# Patient Record
Sex: Female | Born: 1990 | Hispanic: No | Marital: Married | Smoking: Never smoker
Health system: Southern US, Community
[De-identification: ages and names within clinical notes are randomized; demographics above are authoritative.]

## PROBLEM LIST (undated history)

## (undated) ENCOUNTER — Inpatient Hospital Stay (HOSPITAL_COMMUNITY): Payer: Self-pay

## (undated) DIAGNOSIS — R87619 Unspecified abnormal cytological findings in specimens from cervix uteri: Secondary | ICD-10-CM

## (undated) DIAGNOSIS — R87629 Unspecified abnormal cytological findings in specimens from vagina: Secondary | ICD-10-CM

## (undated) DIAGNOSIS — O350XX0 Maternal care for (suspected) central nervous system malformation in fetus, not applicable or unspecified: Secondary | ICD-10-CM

## (undated) DIAGNOSIS — D649 Anemia, unspecified: Secondary | ICD-10-CM

## (undated) DIAGNOSIS — IMO0002 Reserved for concepts with insufficient information to code with codable children: Secondary | ICD-10-CM

## (undated) DIAGNOSIS — O3503X0 Maternal care for (suspected) central nervous system malformation or damage in fetus, choroid plexus cysts, not applicable or unspecified: Secondary | ICD-10-CM

## (undated) HISTORY — DX: Anemia, unspecified: D64.9

## (undated) HISTORY — DX: Maternal care for (suspected) central nervous system malformation or damage in fetus, choroid plexus cysts, not applicable or unspecified: O35.03X0

## (undated) HISTORY — DX: Unspecified abnormal cytological findings in specimens from cervix uteri: R87.619

## (undated) HISTORY — DX: Reserved for concepts with insufficient information to code with codable children: IMO0002

## (undated) HISTORY — PX: NO PAST SURGERIES: SHX2092

## (undated) HISTORY — DX: Maternal care for (suspected) central nervous system malformation in fetus, not applicable or unspecified: O35.0XX0

---

## 2008-03-15 ENCOUNTER — Emergency Department (HOSPITAL_COMMUNITY): Admission: EM | Admit: 2008-03-15 | Discharge: 2008-03-15 | Payer: Self-pay | Admitting: Emergency Medicine

## 2008-05-21 ENCOUNTER — Ambulatory Visit (HOSPITAL_COMMUNITY): Admission: RE | Admit: 2008-05-21 | Discharge: 2008-05-21 | Payer: Self-pay | Admitting: Obstetrics & Gynecology

## 2008-10-15 ENCOUNTER — Ambulatory Visit (HOSPITAL_COMMUNITY): Admission: RE | Admit: 2008-10-15 | Discharge: 2008-10-15 | Payer: Self-pay | Admitting: Obstetrics & Gynecology

## 2008-10-17 ENCOUNTER — Ambulatory Visit: Payer: Self-pay | Admitting: Advanced Practice Midwife

## 2008-10-17 ENCOUNTER — Inpatient Hospital Stay (HOSPITAL_COMMUNITY): Admission: AD | Admit: 2008-10-17 | Discharge: 2008-10-19 | Payer: Self-pay | Admitting: Obstetrics & Gynecology

## 2008-11-05 ENCOUNTER — Ambulatory Visit: Admission: RE | Admit: 2008-11-05 | Discharge: 2008-11-05 | Payer: Self-pay | Admitting: Obstetrics & Gynecology

## 2008-11-09 ENCOUNTER — Ambulatory Visit: Admission: RE | Admit: 2008-11-09 | Discharge: 2008-11-09 | Payer: Self-pay | Admitting: Obstetrics & Gynecology

## 2009-03-01 ENCOUNTER — Ambulatory Visit: Payer: Self-pay | Admitting: Internal Medicine

## 2009-03-19 ENCOUNTER — Ambulatory Visit: Payer: Self-pay | Admitting: Internal Medicine

## 2009-03-19 LAB — CONVERTED CEMR LAB
Chlamydia, Swab/Urine, PCR: NEGATIVE
GC Probe Amp, Urine: NEGATIVE
hCG, Beta Chain, Quant, S: <2 milliintl units/mL

## 2009-03-22 ENCOUNTER — Ambulatory Visit: Payer: Self-pay | Admitting: Internal Medicine

## 2009-03-31 ENCOUNTER — Ambulatory Visit (HOSPITAL_COMMUNITY): Admission: RE | Admit: 2009-03-31 | Discharge: 2009-03-31 | Payer: Self-pay | Admitting: Internal Medicine

## 2009-04-07 ENCOUNTER — Encounter (INDEPENDENT_AMBULATORY_CARE_PROVIDER_SITE_OTHER): Payer: Self-pay | Admitting: Internal Medicine

## 2009-04-07 ENCOUNTER — Ambulatory Visit: Payer: Self-pay | Admitting: Internal Medicine

## 2009-04-07 LAB — CONVERTED CEMR LAB
CRP: 0.1 mg/dL (ref <0.6)
TSH: 1.256 microintl units/mL (ref 0.350–4.500)

## 2009-09-02 ENCOUNTER — Ambulatory Visit (HOSPITAL_COMMUNITY): Admission: RE | Admit: 2009-09-02 | Discharge: 2009-09-02 | Payer: Self-pay | Admitting: Obstetrics & Gynecology

## 2009-11-15 ENCOUNTER — Ambulatory Visit (HOSPITAL_COMMUNITY): Admission: RE | Admit: 2009-11-15 | Discharge: 2009-11-15 | Payer: Self-pay | Admitting: Family Medicine

## 2009-12-06 ENCOUNTER — Inpatient Hospital Stay (HOSPITAL_COMMUNITY): Admission: RE | Admit: 2009-12-06 | Discharge: 2009-12-06 | Payer: Self-pay | Admitting: Family Medicine

## 2009-12-07 ENCOUNTER — Inpatient Hospital Stay (HOSPITAL_COMMUNITY)
Admission: AD | Admit: 2009-12-07 | Discharge: 2009-12-07 | Payer: Self-pay | Source: Home / Self Care | Admitting: Family Medicine

## 2010-01-26 ENCOUNTER — Inpatient Hospital Stay (HOSPITAL_COMMUNITY)
Admission: AD | Admit: 2010-01-26 | Discharge: 2010-01-28 | Payer: Self-pay | Source: Home / Self Care | Attending: Family Medicine | Admitting: Family Medicine

## 2010-01-31 LAB — RPR: RPR Ser Ql: NONREACTIVE

## 2010-03-29 LAB — FETAL FIBRONECTIN: Fetal Fibronectin: NEGATIVE

## 2010-04-21 LAB — HEPATITIS B SURFACE ANTIGEN: Hepatitis B Surface Ag: NEGATIVE

## 2010-04-21 LAB — CBC
HCT: 39.3 % (ref 36.0–46.0)
Hemoglobin: 13.7 g/dL (ref 12.0–15.0)
MCHC: 34.9 g/dL (ref 30.0–36.0)
MCV: 95 fL (ref 78.0–100.0)
Platelets: 215 10*3/uL (ref 150–400)
RBC: 4.13 MIL/uL (ref 3.87–5.11)
RDW: 13 % (ref 11.5–15.5)
WBC: 11.1 10*3/uL — ABNORMAL HIGH (ref 4.0–10.5)

## 2010-04-21 LAB — RPR: RPR Ser Ql: NONREACTIVE

## 2010-07-26 ENCOUNTER — Other Ambulatory Visit: Payer: Self-pay | Admitting: Family Medicine

## 2010-07-26 DIAGNOSIS — Z3687 Encounter for antenatal screening for uncertain dates: Secondary | ICD-10-CM

## 2010-07-26 DIAGNOSIS — N889 Noninflammatory disorder of cervix uteri, unspecified: Secondary | ICD-10-CM

## 2010-07-26 LAB — ABO/RH: RH Type: POSITIVE

## 2010-07-26 LAB — HIV ANTIBODY (ROUTINE TESTING W REFLEX): HIV: NONREACTIVE

## 2010-07-26 LAB — ANTIBODY SCREEN: Antibody Screen: NEGATIVE

## 2010-07-26 LAB — RUBELLA ANTIBODY, IGM: Rubella: IMMUNE

## 2010-08-01 ENCOUNTER — Other Ambulatory Visit: Payer: Self-pay | Admitting: Family Medicine

## 2010-08-01 ENCOUNTER — Ambulatory Visit (HOSPITAL_COMMUNITY)
Admission: RE | Admit: 2010-08-01 | Discharge: 2010-08-01 | Disposition: A | Discharge status: Home / Self Care | Payer: Medicaid Other | Source: Ambulatory Visit | Attending: Family Medicine | Admitting: Family Medicine

## 2010-08-01 DIAGNOSIS — Z3689 Encounter for other specified antenatal screening: Secondary | ICD-10-CM | POA: Insufficient documentation

## 2010-08-01 DIAGNOSIS — N889 Noninflammatory disorder of cervix uteri, unspecified: Secondary | ICD-10-CM

## 2010-08-01 DIAGNOSIS — Z8751 Personal history of pre-term labor: Secondary | ICD-10-CM | POA: Insufficient documentation

## 2010-08-01 DIAGNOSIS — Z3687 Encounter for antenatal screening for uncertain dates: Secondary | ICD-10-CM

## 2010-12-15 ENCOUNTER — Other Ambulatory Visit (HOSPITAL_COMMUNITY): Payer: Self-pay | Admitting: Nurse Practitioner

## 2010-12-15 DIAGNOSIS — Z3689 Encounter for other specified antenatal screening: Secondary | ICD-10-CM

## 2010-12-19 ENCOUNTER — Ambulatory Visit (HOSPITAL_COMMUNITY)
Admission: RE | Admit: 2010-12-19 | Discharge: 2010-12-19 | Disposition: A | Discharge status: Home / Self Care | Payer: Medicaid Other | Source: Ambulatory Visit | Attending: Nurse Practitioner | Admitting: Nurse Practitioner

## 2010-12-19 DIAGNOSIS — O358XX0 Maternal care for other (suspected) fetal abnormality and damage, not applicable or unspecified: Secondary | ICD-10-CM | POA: Insufficient documentation

## 2010-12-19 DIAGNOSIS — Z363 Encounter for antenatal screening for malformations: Secondary | ICD-10-CM | POA: Insufficient documentation

## 2010-12-19 DIAGNOSIS — Z3689 Encounter for other specified antenatal screening: Secondary | ICD-10-CM

## 2010-12-19 DIAGNOSIS — O0930 Supervision of pregnancy with insufficient antenatal care, unspecified trimester: Secondary | ICD-10-CM | POA: Insufficient documentation

## 2010-12-19 DIAGNOSIS — Z1389 Encounter for screening for other disorder: Secondary | ICD-10-CM | POA: Insufficient documentation

## 2011-01-13 LAB — STREP B DNA PROBE: GBS: NEGATIVE

## 2011-01-17 NOTE — L&D Delivery Note (Signed)
Delivery Note At 5:40 AM a viable and healthy female was delivered via Vaginal, Spontaneous Delivery (Presentation: Left Occiput Posterior).  APGAR: 9, 9; weight 8 lb 7.1 oz (3830 g).   Placenta status: Intact, Spontaneous.  Cord: 3 vessels with the following complications: None.  Cord pH: not indicated.  Anesthesia: None  Episiotomy: None Lacerations: None Suture Repair: n/a Est. Blood Loss (mL): 200  Mom to postpartum.  Baby to nursery-stable.   D. Piloto Sherron Flemings Paz. MD PGY-1  02/09/2011, 8:05 AM   Present for 2nd and 3rd stages. Nuchal cord reduced. IM pit given Simone Tuckey 8:08 AM

## 2011-01-25 ENCOUNTER — Other Ambulatory Visit (HOSPITAL_COMMUNITY): Payer: Self-pay | Admitting: Physician Assistant

## 2011-01-26 ENCOUNTER — Ambulatory Visit (HOSPITAL_COMMUNITY)
Admission: RE | Admit: 2011-01-26 | Discharge: 2011-01-26 | Disposition: A | Discharge status: Home / Self Care | Payer: Medicaid Other | Source: Ambulatory Visit | Attending: Physician Assistant | Admitting: Physician Assistant

## 2011-01-26 DIAGNOSIS — Z3689 Encounter for other specified antenatal screening: Secondary | ICD-10-CM | POA: Insufficient documentation

## 2011-01-26 DIAGNOSIS — O0930 Supervision of pregnancy with insufficient antenatal care, unspecified trimester: Secondary | ICD-10-CM | POA: Insufficient documentation

## 2011-02-07 ENCOUNTER — Other Ambulatory Visit (HOSPITAL_COMMUNITY): Payer: Self-pay | Admitting: Physician Assistant

## 2011-02-07 DIAGNOSIS — O480 Post-term pregnancy: Secondary | ICD-10-CM

## 2011-02-08 ENCOUNTER — Telehealth (HOSPITAL_COMMUNITY): Payer: Self-pay | Admitting: *Deleted

## 2011-02-08 ENCOUNTER — Encounter (HOSPITAL_COMMUNITY): Payer: Self-pay | Admitting: *Deleted

## 2011-02-08 NOTE — Telephone Encounter (Signed)
Preadmission screen  

## 2011-02-09 ENCOUNTER — Ambulatory Visit (HOSPITAL_COMMUNITY): Payer: Medicaid Other

## 2011-02-09 ENCOUNTER — Inpatient Hospital Stay (HOSPITAL_COMMUNITY)
Admission: AD | Admit: 2011-02-09 | Discharge: 2011-02-10 | DRG: 775 | Disposition: A | Discharge status: Home / Self Care | Payer: Medicaid Other | Source: Ambulatory Visit | Attending: Obstetrics & Gynecology | Admitting: Obstetrics & Gynecology

## 2011-02-09 ENCOUNTER — Encounter (HOSPITAL_COMMUNITY): Payer: Self-pay | Admitting: *Deleted

## 2011-02-09 LAB — CBC
HCT: 28.9 % — ABNORMAL LOW (ref 36.0–46.0)
MCH: 27.8 pg (ref 26.0–34.0)
MCV: 84.5 fL (ref 78.0–100.0)
RBC: 3.42 MIL/uL — ABNORMAL LOW (ref 3.87–5.11)
RDW: 14.4 % (ref 11.5–15.5)
WBC: 8.4 10*3/uL (ref 4.0–10.5)

## 2011-02-09 MED ORDER — BENZOCAINE-MENTHOL 20-0.5 % EX AERO: 1.0000 "application " | INHALATION_SPRAY | CUTANEOUS | Status: DC | PRN

## 2011-02-09 MED ORDER — OXYTOCIN 10 UNIT/ML IJ SOLN
10.0000 [IU] | Freq: Once | INTRAMUSCULAR | Status: AC
  Administered 2011-02-09: 10 [IU] via INTRAMUSCULAR

## 2011-02-09 MED ORDER — ONDANSETRON HCL 4 MG/2ML IJ SOLN: 4.0000 mg | INTRAMUSCULAR | Status: DC | PRN

## 2011-02-09 MED ORDER — FLEET ENEMA 7-19 GM/118ML RE ENEM: 1.0000 | ENEMA | RECTAL | Status: DC | PRN

## 2011-02-09 MED ORDER — DIPHENHYDRAMINE HCL 25 MG PO CAPS: 25.0000 mg | ORAL_CAPSULE | Freq: Four times a day (QID) | ORAL | Status: DC | PRN

## 2011-02-09 MED ORDER — LIDOCAINE HCL (PF) 1 % IJ SOLN
30.0000 mL | INTRAMUSCULAR | Status: DC | PRN
  Filled 2011-02-09: qty 30

## 2011-02-09 MED ORDER — LACTATED RINGERS IV SOLN
500.0000 mL | INTRAVENOUS | Status: DC | PRN
Start: 2011-02-09 — End: 2011-02-09

## 2011-02-09 MED ORDER — OXYTOCIN 10 UNIT/ML IJ SOLN
INTRAMUSCULAR | Status: AC
  Filled 2011-02-09: qty 2

## 2011-02-09 MED ORDER — DIBUCAINE 1 % RE OINT: 1.0000 "application " | TOPICAL_OINTMENT | RECTAL | Status: DC | PRN

## 2011-02-09 MED ORDER — OXYTOCIN BOLUS FROM INFUSION
500.0000 mL | Freq: Once | INTRAVENOUS | Status: DC
  Filled 2011-02-09: qty 500

## 2011-02-09 MED ORDER — OXYCODONE-ACETAMINOPHEN 5-325 MG PO TABS
1.0000 | ORAL_TABLET | ORAL | Status: DC | PRN
  Administered 2011-02-10: 1 via ORAL
  Filled 2011-02-09: qty 1

## 2011-02-09 MED ORDER — IBUPROFEN 600 MG PO TABS
600.0000 mg | ORAL_TABLET | Freq: Four times a day (QID) | ORAL | Status: DC | PRN
  Administered 2011-02-09: 600 mg via ORAL
  Filled 2011-02-09: qty 1

## 2011-02-09 MED ORDER — ZOLPIDEM TARTRATE 5 MG PO TABS: 5.0000 mg | ORAL_TABLET | Freq: Every evening | ORAL | Status: DC | PRN

## 2011-02-09 MED ORDER — ONDANSETRON HCL 4 MG PO TABS: 4.0000 mg | ORAL_TABLET | ORAL | Status: DC | PRN

## 2011-02-09 MED ORDER — TETANUS-DIPHTH-ACELL PERTUSSIS 5-2.5-18.5 LF-MCG/0.5 IM SUSP
0.5000 mL | Freq: Once | INTRAMUSCULAR | Status: AC
  Administered 2011-02-10: 0.5 mL via INTRAMUSCULAR
  Filled 2011-02-09: qty 0.5

## 2011-02-09 MED ORDER — LANOLIN HYDROUS EX OINT: TOPICAL_OINTMENT | CUTANEOUS | Status: DC | PRN

## 2011-02-09 MED ORDER — PRENATAL MULTIVITAMIN CH
1.0000 | ORAL_TABLET | Freq: Every day | ORAL | Status: DC
  Administered 2011-02-09 – 2011-02-10 (×2): 1 via ORAL
  Filled 2011-02-09 (×2): qty 1

## 2011-02-09 MED ORDER — SENNOSIDES-DOCUSATE SODIUM 8.6-50 MG PO TABS
2.0000 | ORAL_TABLET | Freq: Every day | ORAL | Status: DC
  Administered 2011-02-09: 2 via ORAL

## 2011-02-09 MED ORDER — IBUPROFEN 600 MG PO TABS
600.0000 mg | ORAL_TABLET | Freq: Four times a day (QID) | ORAL | Status: DC
  Administered 2011-02-09 – 2011-02-10 (×4): 600 mg via ORAL
  Filled 2011-02-09 (×4): qty 1

## 2011-02-09 MED ORDER — WITCH HAZEL-GLYCERIN EX PADS: 1.0000 "application " | MEDICATED_PAD | CUTANEOUS | Status: DC | PRN

## 2011-02-09 MED ORDER — OXYTOCIN 20 UNITS IN LACTATED RINGERS INFUSION - SIMPLE: 125.0000 mL/h | Freq: Once | INTRAVENOUS | Status: DC

## 2011-02-09 MED ORDER — LACTATED RINGERS IV SOLN: INTRAVENOUS | Status: DC

## 2011-02-09 MED ORDER — OXYCODONE-ACETAMINOPHEN 5-325 MG PO TABS: 2.0000 | ORAL_TABLET | ORAL | Status: DC | PRN

## 2011-02-09 MED ORDER — ACETAMINOPHEN 325 MG PO TABS: 650.0000 mg | ORAL_TABLET | ORAL | Status: DC | PRN

## 2011-02-09 MED ORDER — SIMETHICONE 80 MG PO CHEW: 80.0000 mg | CHEWABLE_TABLET | ORAL | Status: DC | PRN

## 2011-02-09 MED ORDER — ONDANSETRON HCL 4 MG/2ML IJ SOLN: 4.0000 mg | Freq: Four times a day (QID) | INTRAMUSCULAR | Status: DC | PRN

## 2011-02-09 MED ORDER — CITRIC ACID-SODIUM CITRATE 334-500 MG/5ML PO SOLN: 30.0000 mL | ORAL | Status: DC | PRN

## 2011-02-09 NOTE — H&P (Signed)
Shannon Hernandez is a 21 y.o. female presenting with regular painful contractions. No vaginal leakage of  fluid or bleeding. No headaches, vision disturbances nor epigastric pain. [facility]    History OB History    Grav Para Term Preterm Abortions TAB SAB Ect Mult Living   3 3 3       3      Past Medical History  Diagnosis Date  . Anemia   . Abnormal Pap smear     ASCUS  . Choroid plexus cysts, fetal, affecting care of mother, antepartum    History reviewed. No pertinent past surgical history. Family History: family history includes Asthma in her brother and Diabetes in her paternal grandfather. Social History:  reports that she has never smoked. She has never used smokeless tobacco. She reports that she does not drink alcohol or use illicit drugs.  Review of Systems  Constitutional: Negative.  Negative for fever.  Eyes: Negative for blurred vision.  Respiratory: Negative.   Cardiovascular: Negative.   Gastrointestinal: Positive for abdominal pain.  Genitourinary: Negative.   Musculoskeletal: Negative.   Skin: Negative.   Neurological: Negative.  Negative for headaches.    Dilation: 10 Effacement (%): 100 Station: 0 Exam by:: Piloto,CNM Blood pressure 110/67, pulse 81, temperature 98 F (36.7 C), temperature source Oral, resp. rate 20, height 5\' 6"  (1.676 m), weight 59.875 kg (132 lb), unknown if currently breastfeeding. Exam Physical Exam  Constitutional: She appears distressed.       Due to painful contractions.  HENT:  Mouth/Throat: Oropharynx is clear and moist.  Eyes: Conjunctivae are normal.  Neck: Neck supple.  Cardiovascular: Normal rate, regular rhythm and normal heart sounds.   No murmur heard. Respiratory: Effort normal and breath sounds normal.  GI: Soft. Bowel sounds are normal.    Prenatal labs: ABO, Rh: B/Positive/-- (07/10 0000) Antibody: Negative (07/10 0000) Rubella: Immune (07/10 0000) RPR: Nonreactive (07/10  0000)  HBsAg:   negative HIV: Non-reactive (07/10 0000)  GBS: Negative (12/28 0000)  2h GTT 76/89/90  Assessment: 1. Labor: active.  2. Fetal Wellbeing: Category 1 3. Pain control: no needed pt came fully dilated and pushing. 4. GBS: negative. 5. 40.5  week IUP  Plan:  1. Admit to BS 2. Routine L&D orders 3. Analgesia/anesthesia PRN    D. Piloto [employer]. MD PGY-1 02/09/2011, 6:26 AM

## 2011-02-09 NOTE — Progress Notes (Signed)
Dr. Aviva Signs notified of pt VE.  Will take to [facility] room 166. MD to place orders.

## 2011-02-09 NOTE — Progress Notes (Signed)
UR chart review completed.  

## 2011-02-09 NOTE — Progress Notes (Signed)
Pt states ctx started about an hour ago.  Was 3 cm on Tuesday.

## 2011-02-09 NOTE — Progress Notes (Signed)
Pt brought back from lobby in wheelchair for discomfort.  efm and toco placed.

## 2011-02-09 NOTE — Progress Notes (Signed)
Delivery of a live viable female at 34.

## 2011-02-10 ENCOUNTER — Inpatient Hospital Stay (HOSPITAL_COMMUNITY): Payer: Medicaid Other

## 2011-02-10 MED ORDER — IBUPROFEN 600 MG PO TABS: 600.0000 mg | ORAL_TABLET | Freq: Four times a day (QID) | ORAL | Status: AC

## 2011-02-10 NOTE — Progress Notes (Signed)
Post Partum Day 1 Subjective: no complaints, up ad lib, voiding, tolerating PO, + flatus and br/bo. Patient feeling well and wants to be d/c today.  Objective: Blood pressure 93/63, pulse 90, temperature 97.9 F (36.6 C), temperature source Oral, resp. rate 18, height 5\' 6"  (1.676 m), weight 59.875 kg (132 lb), unknown if currently breastfeeding.  Physical Exam:  General: alert, cooperative and no distress Lochia: appropriate Uterine Fundus: firm Incision: NA DVT Evaluation: No evidence of DVT seen on physical exam. Negative Homan's sign.   Basename 02/09/11 0510  HGB 9.5*  HCT 28.9*    Assessment/Plan: Discharge home and Contraception Husband states that they do not wish to use contraception.   LOS: 1 day   Arthor Captain 02/10/2011, 7:41 AM

## 2011-02-10 NOTE — Discharge Summary (Signed)
Obstetric Discharge Summary Reason for Admission: onset of labor Prenatal Procedures: none Intrapartum Procedures: spontaneous vaginal delivery Postpartum Procedures: none Complications-Operative and Postpartum: none Hemoglobin  Date Value Range Status  02/09/2011 9.5* 12.0-15.0 (g/dL) Final     HCT  Date Value Range Status  02/09/2011 28.9* 36.0-46.0 (%) Final    Discharge Diagnoses: Term Pregnancy-delivered  Discharge Information: Date: 02/10/2011 Activity: unrestricted Diet: routine Medications: PNV and Ibuprofen Condition: stable Instructions: refer to practice specific booklet Discharge to: home Follow-up Information    Follow up with [facility] in 6 weeks.         Newborn Data: Live born female  Birth Weight: 8 lb 7.1 oz (3830 g) APGAR: 9, 9  Home with mother.  Shannon Hernandez 02/10/2011, 7:27 AM

## 2011-02-10 NOTE — Progress Notes (Signed)
I agree with PA physical findings and plan.  D. Piloto De La Paz PYG-1

## 2011-02-14 ENCOUNTER — Inpatient Hospital Stay (HOSPITAL_COMMUNITY): Admission: RE | Admit: 2011-02-14 | Payer: Medicaid Other | Source: Ambulatory Visit

## 2011-12-07 ENCOUNTER — Ambulatory Visit: Payer: Medicaid Other | Admitting: Family Medicine

## 2011-12-13 ENCOUNTER — Encounter: Payer: Self-pay | Admitting: Emergency Medicine

## 2011-12-13 ENCOUNTER — Ambulatory Visit (INDEPENDENT_AMBULATORY_CARE_PROVIDER_SITE_OTHER): Payer: Self-pay | Admitting: Emergency Medicine

## 2011-12-13 VITALS — BP 93/60 | HR 82 | Temp 98.7°F | Ht 66.75 in | Wt 99.0 lb

## 2011-12-13 DIAGNOSIS — Z0000 Encounter for general adult medical examination without abnormal findings: Secondary | ICD-10-CM | POA: Insufficient documentation

## 2011-12-13 DIAGNOSIS — J309 Allergic rhinitis, unspecified: Secondary | ICD-10-CM

## 2011-12-13 DIAGNOSIS — R636 Underweight: Secondary | ICD-10-CM

## 2011-12-13 DIAGNOSIS — J302 Other seasonal allergic rhinitis: Secondary | ICD-10-CM

## 2011-12-13 NOTE — Progress Notes (Signed)
  Subjective:    Patient ID: Shannon Hernandez, female    DOB: 07/13/1990, 21 y.o.   MRN: [id number]  HPI Galena E Golonka is here for a new patient appt.  I have reviewed and updated the following as appropriate: allergies, current medications, past family history, past medical history, past social history, past surgical history and problem list SHx: never smoker  Allergies: Has a history of seasonal allergies.   Takes loratadine as needed for this.  Complaining of some red/itchy eyes today.  Pelvic pain: Occurs after walking for 3 hours.  Resolves with rest.   Review of Systems See HPI    Objective:   Physical Exam BP 93/60  Pulse 82  Temp 98.7 F (37.1 C) (Oral)  Ht 5' 6.75" (1.695 m)  Wt 99 lb (44.906 kg)  BMI 15.62 kg/m2  LMP 10/27/2011 Gen: alert, cooperative, very thin HEENT: At/[place], sclera mildly injected bilaterally, PERRL, MMM, no pharyngeal erythema or exudate Neck: supple, no LAD CV: RRR, no murmurs Pulm: CTAB, no wheezes or rales Abd: +BS, scaphoid, soft, NTND Ext: no edema, 2+ DP and radial pulses Neuro: normal gait Skin: no rashes or lesions      Assessment & Plan:

## 2011-12-13 NOTE — Assessment & Plan Note (Signed)
Taking loratadine as needed.  Will use saline eye drops for eye symptoms.  Would consider allergy eye drops once she has the orange card.

## 2011-12-13 NOTE — Patient Instructions (Addendum)
It was nice to meet you! Get some saline drops for you eyes.  Once you get the orange card, I can prescribe some allergy eye drops if your eyes are still bothering you. Do the exercises on the handout to help with the pelvic pain after walking. I will see you back in 1 year for your annual exam or sooner as needed.

## 2011-12-13 NOTE — Assessment & Plan Note (Signed)
Doing well.  No acute concerns.  Pap due 03/2012.  Declined flu shot.  Does not desire birth control.

## 2012-07-09 IMAGING — US US OB TRANSVAGINAL
1 series · 13 of 28 positions shown · non-contrast
Comparison: none

[Series 1: us ob comp +14 wk · 13 of 43 slices shown]
[im 2/43]
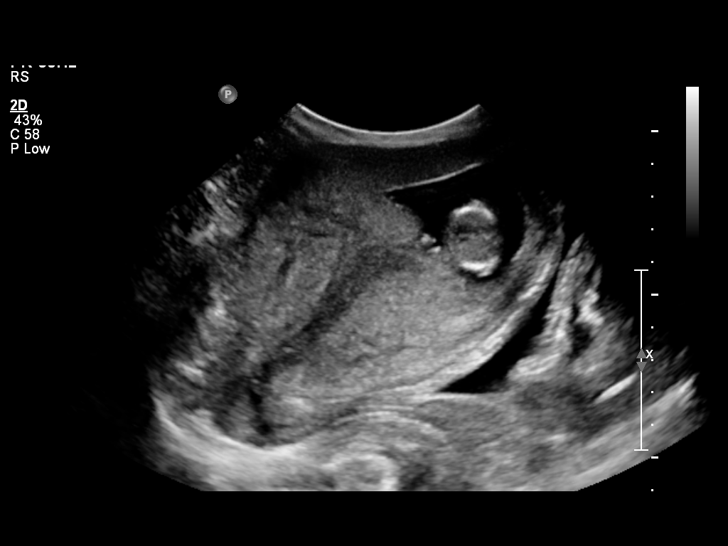
[im 5/43]
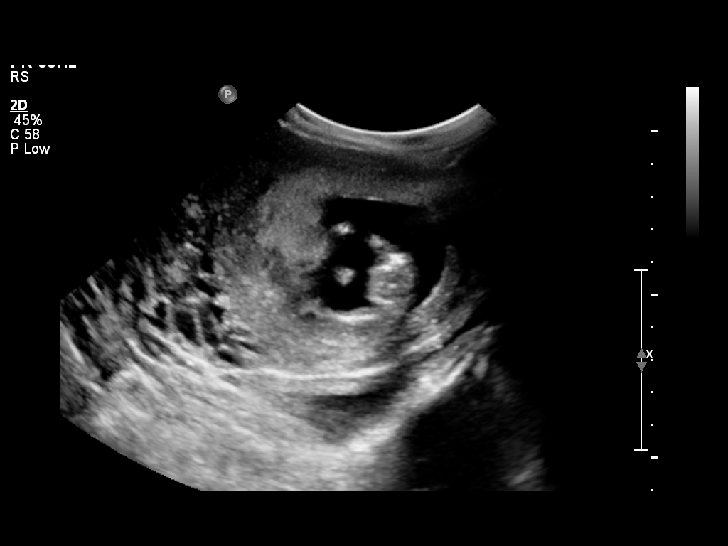
[im 8/43]
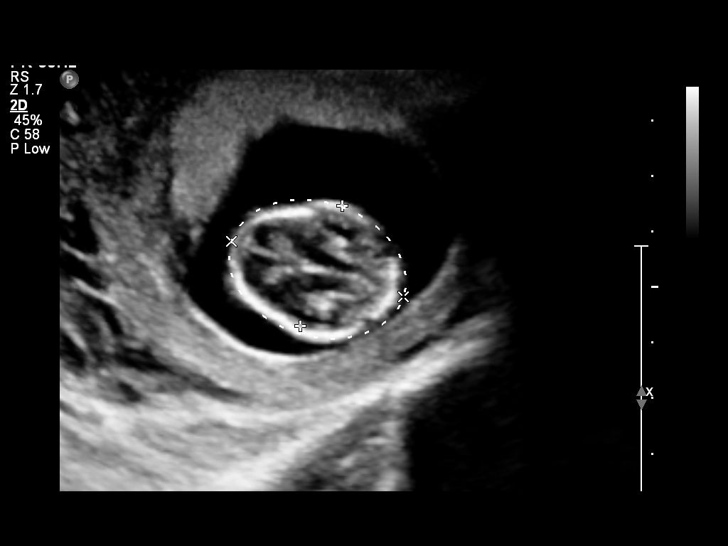
[im 11/43]
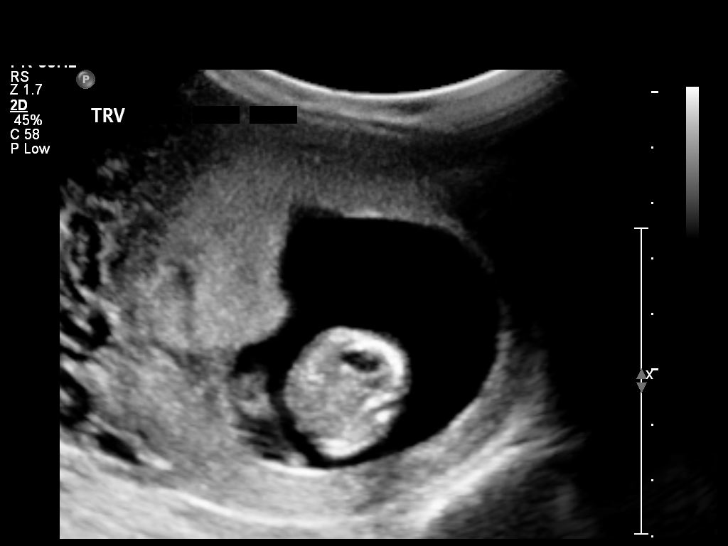
[im 15/43]
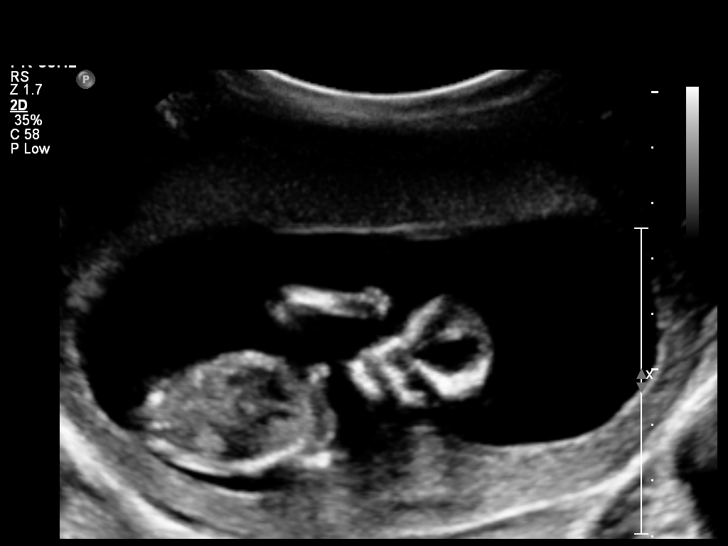
[im 18/43]
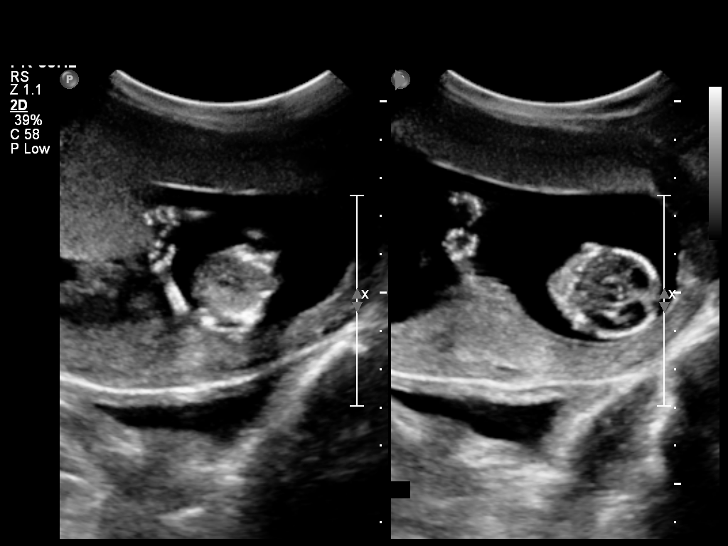
[im 22/43]
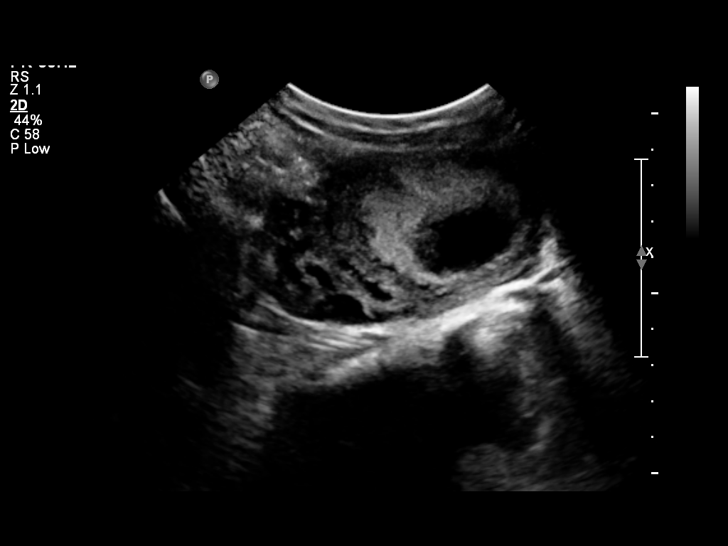
[im 25/43]
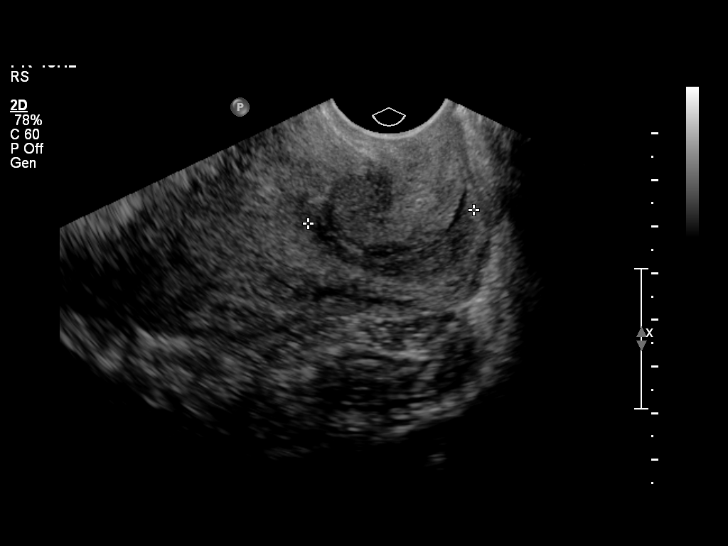
[im 29/43]
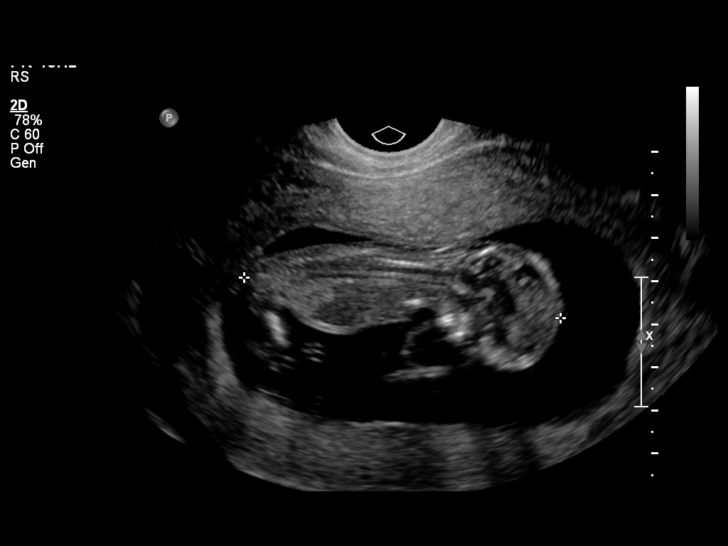
[im 32/43]
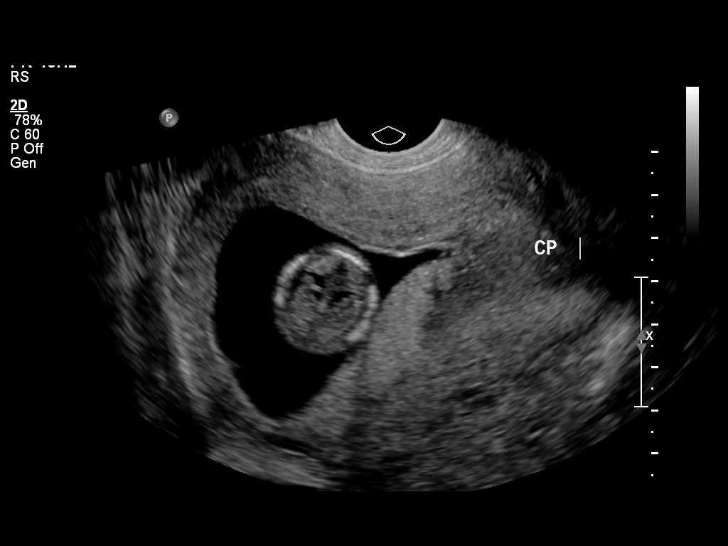
[im 35/43]
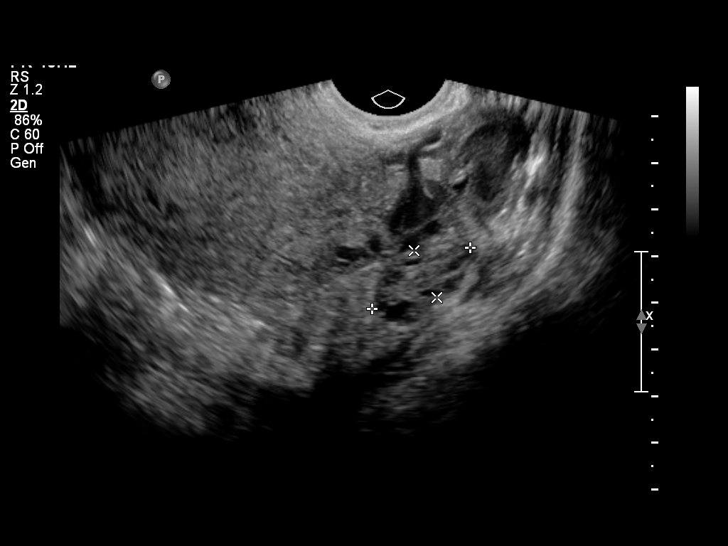
[im 38/43]
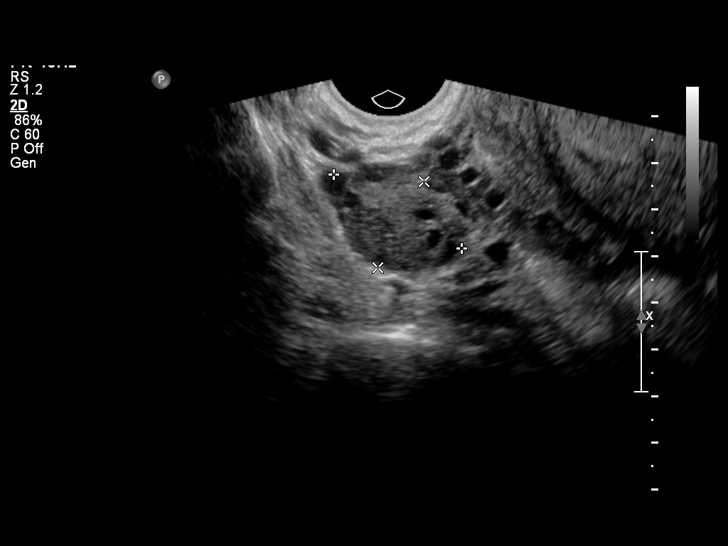
[im 41/43]
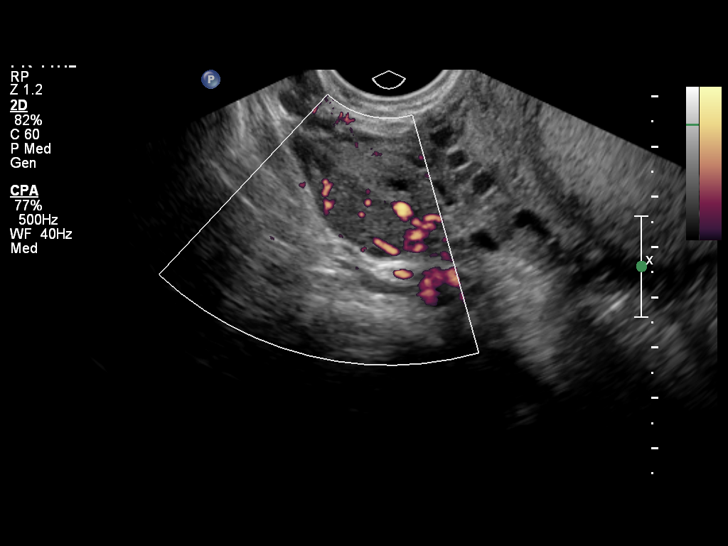

[13 of 28 positions shown; findings below may reference images not displayed]

OBSTETRICS REPORT
                      (Signed Final 08/01/2010 [DATE])

 Order#:         [id number]
                 _O
Procedures

 US OB COMP LESS 14 WKS                                76801.0
 US OB TRANSVAGINAL                                    76817.0
Indications

 Unsure of LMP;  Establish Gestational [AGE]
 Poor obstetric history: Previous preterm delivery
 Assess cervical length
Fetal Evaluation

 Fetal Heart Rate:  152                          bpm
 Cardiac Activity:  Observed
 Presentation:      Variable

 Amniotic Fluid
 AFI FV:      Subjectively within normal limits
Biometry

 CRL:       74  mm     G. Age:  13w 2d                 EDD:    02/04/11

 BPD:     22.8  mm     G. Age:  13w 5d                CI:
 HC:      89.6  mm     G. Age:  14w 0d
Gestational Age

 U/S Today:     13w 6d                                        EDD:   01/31/11
 Best:          13w 2d     Det. By:  U/S C R L (08/01/10)     EDD:   02/04/11
Anatomy

 Choroid Plexus:    Appears normal      Abdominal Wall:    Appears nml
                                                           (cord insert,
                                                           abd wall)
 Stomach:           Appears             Bladder:           Appears normal
                    normal, left
                    sided

 Other:     Technically difficult due to early GA.
Cervix Uterus Adnexa

 Cervical Length:    3.56     cm

 Cervix:       Closed.
 Left Ovary:    Within normal limits. 2.5cm x 1.1cm x  2.0cm
 Right Ovary:   Small corpus luteum noted. 3.2cm x 2.1cm x 1.8cm
Impression

 Single living IUP with US Gest. Age of 13w 2d, and EDD of
 02/04/2011.
 No significant maternal uterine or adnexal abnormality
 identified.
 Normal cervical length.
Recommendations

 US for fetal anatomic evaluation at 18-19 wks GA.

 questions or concerns.

## 2013-08-01 ENCOUNTER — Inpatient Hospital Stay (HOSPITAL_COMMUNITY): Payer: Medicaid Other

## 2013-08-01 ENCOUNTER — Encounter (HOSPITAL_COMMUNITY): Payer: Self-pay | Admitting: *Deleted

## 2013-08-01 ENCOUNTER — Inpatient Hospital Stay (HOSPITAL_COMMUNITY)
Admission: AD | Admit: 2013-08-01 | Discharge: 2013-08-01 | Disposition: A | Discharge status: Home / Self Care | Payer: Medicaid Other | Source: Ambulatory Visit | Attending: Obstetrics & Gynecology | Admitting: Obstetrics & Gynecology

## 2013-08-01 DIAGNOSIS — O4690 Antepartum hemorrhage, unspecified, unspecified trimester: Secondary | ICD-10-CM

## 2013-08-01 DIAGNOSIS — O3500X0 Maternal care for (suspected) central nervous system malformation or damage in fetus, unspecified, not applicable or unspecified: Secondary | ICD-10-CM | POA: Diagnosis not present

## 2013-08-01 DIAGNOSIS — O350XX0 Maternal care for (suspected) central nervous system malformation in fetus, not applicable or unspecified: Secondary | ICD-10-CM | POA: Diagnosis not present

## 2013-08-01 DIAGNOSIS — O209 Hemorrhage in early pregnancy, unspecified: Secondary | ICD-10-CM

## 2013-08-01 DIAGNOSIS — O208 Other hemorrhage in early pregnancy: Secondary | ICD-10-CM | POA: Insufficient documentation

## 2013-08-01 DIAGNOSIS — O468X1 Other antepartum hemorrhage, first trimester: Secondary | ICD-10-CM

## 2013-08-01 DIAGNOSIS — O418X10 Other specified disorders of amniotic fluid and membranes, first trimester, not applicable or unspecified: Secondary | ICD-10-CM

## 2013-08-01 LAB — OB RESULTS CONSOLE GC/CHLAMYDIA
Chlamydia: NEGATIVE
Gonorrhea: NEGATIVE

## 2013-08-01 LAB — CBC WITH DIFFERENTIAL/PLATELET
Basophils Absolute: 0 10*3/uL (ref 0.0–0.1)
Basophils Relative: 0 % (ref 0–1)
EOS PCT: 2 % (ref 0–5)
Eosinophils Absolute: 0.1 10*3/uL (ref 0.0–0.7)
HCT: 32.1 % — ABNORMAL LOW (ref 36.0–46.0)
Hemoglobin: 11 g/dL — ABNORMAL LOW (ref 12.0–15.0)
LYMPHS ABS: 2.2 10*3/uL (ref 0.7–4.0)
LYMPHS PCT: 35 % (ref 12–46)
MCH: 30.4 pg (ref 26.0–34.0)
MCHC: 34.3 g/dL (ref 30.0–36.0)
MCV: 88.7 fL (ref 78.0–100.0)
MONO ABS: 0.4 10*3/uL (ref 0.1–1.0)
Monocytes Relative: 7 % (ref 3–12)
Neutro Abs: 3.6 10*3/uL (ref 1.7–7.7)
Neutrophils Relative %: 56 % (ref 43–77)
Platelets: 153 10*3/uL (ref 150–400)
RBC: 3.62 MIL/uL — AB (ref 3.87–5.11)
RDW: 13.4 % (ref 11.5–15.5)
WBC: 6.3 10*3/uL (ref 4.0–10.5)

## 2013-08-01 LAB — WET PREP, GENITAL
Clue Cells Wet Prep HPF POC: NONE SEEN
TRICH WET PREP: NONE SEEN
Yeast Wet Prep HPF POC: NONE SEEN

## 2013-08-01 LAB — URINALYSIS, ROUTINE W REFLEX MICROSCOPIC
BILIRUBIN URINE: NEGATIVE
Glucose, UA: NEGATIVE mg/dL
KETONES UR: NEGATIVE mg/dL
Leukocytes, UA: NEGATIVE
NITRITE: NEGATIVE
PH: 6 (ref 5.0–8.0)
PROTEIN: NEGATIVE mg/dL
Specific Gravity, Urine: <1.005 — ABNORMAL LOW (ref 1.005–1.030)
Urobilinogen, UA: 0.2 mg/dL (ref 0.0–1.0)

## 2013-08-01 LAB — URINE MICROSCOPIC-ADD ON

## 2013-08-01 LAB — POCT PREGNANCY, URINE: Preg Test, Ur: POSITIVE — AB

## 2013-08-01 LAB — HCG, QUANTITATIVE, PREGNANCY: hCG, Beta Chain, Quant, S: 62903 m[IU]/mL — ABNORMAL HIGH (ref <5)

## 2013-08-01 NOTE — MAU Provider Note (Signed)
History     CSN: [id number]  Arrival date and time: 08/01/13 4098   First Provider Initiated Contact with Patient 08/01/13 0130      No chief complaint on file.  HPI Ms. Shannon Hernandez is a 23 y.o. [phone] at [redacted]w[redacted]d by LMP who presents to MAU today with complaint of vaginal bleeding since 1230 am. She states LMP of 06/07/13. She is having mild low back pain. She denies abdominal pain, discharge, fever, N/V/D or constipation. She states that bleeding is a dark brown color.   OB History   Grav Para Term Preterm Abortions TAB SAB Ect Mult Living   4 3 3       3       Past Medical History  Diagnosis Date  . Anemia   . Abnormal Pap smear     ASCUS  . Choroid plexus cysts, fetal, affecting care of mother, antepartum     No past surgical history on file.  Family History  Problem Relation Age of Onset  . Asthma Brother   . Diabetes Paternal Grandfather     History  Substance Use Topics  . Smoking status: Never Smoker   . Smokeless tobacco: Never Used  . Alcohol Use: No    Allergies: No Known Allergies  Prescriptions prior to admission  Medication Sig Dispense Refill  . Multiple Vitamin (MULTIVITAMIN) tablet Take 1 tablet by mouth daily.      Marland Kitchen loratadine (CLARITIN) 10 MG tablet Take 10 mg by mouth daily.        Review of Systems  Constitutional: Negative for fever and malaise/fatigue.  Gastrointestinal: Negative for nausea, vomiting, abdominal pain, diarrhea and constipation.  Genitourinary: Negative for dysuria, urgency and frequency.       + vaginal bleeding Neg - vaginal discharge  Musculoskeletal: Positive for back pain.   Physical Exam   Blood pressure 112/68, pulse 78, temperature 98.4 F (36.9 C), resp. rate 20, height 5\' 6"  (1.676 m), weight 105 lb (47.628 kg), last menstrual period 06/07/2013.  Physical Exam  Constitutional: She is oriented to person, place, and time. She appears well-developed and well-nourished. No distress.  HENT:  Head:  Normocephalic.  Cardiovascular: Normal rate.   Respiratory: Effort normal.  GI: Soft. She exhibits no distension and no mass. There is no tenderness. There is no rebound and no guarding.  Genitourinary: Uterus is enlarged (appropriate for GA). Uterus is not tender. Cervix exhibits no motion tenderness, no discharge and no friability. Right adnexum displays no mass and no tenderness. Left adnexum displays no mass and no tenderness. There is bleeding (small amount of thin, brown blood noted) around the vagina. No vaginal discharge found.  Neurological: She is alert and oriented to person, place, and time.  Skin: Skin is warm and dry. No erythema.  Psychiatric: She has a normal mood and affect.   Results for orders placed during the hospital encounter of 08/01/13 (from the past 24 hour(s))  URINALYSIS, ROUTINE W REFLEX MICROSCOPIC     Status: Abnormal   Collection Time    08/01/13  1:02 AM      Result Value Ref Range   Color, Urine YELLOW  YELLOW   APPearance CLEAR  CLEAR   Specific Gravity, Urine <1.005 (*) 1.005 - 1.030   pH 6.0  5.0 - 8.0   Glucose, UA NEGATIVE  NEGATIVE mg/dL   Hgb urine dipstick LARGE (*) NEGATIVE   Bilirubin Urine NEGATIVE  NEGATIVE   Ketones, ur NEGATIVE  NEGATIVE mg/dL  Protein, ur NEGATIVE  NEGATIVE mg/dL   Urobilinogen, UA 0.2  0.0 - 1.0 mg/dL   Nitrite NEGATIVE  NEGATIVE   Leukocytes, UA NEGATIVE  NEGATIVE  URINE MICROSCOPIC-ADD ON     Status: None   Collection Time    08/01/13  1:02 AM      Result Value Ref Range   Squamous Epithelial / LPF RARE  RARE   WBC, UA 0-2  <3 WBC/hpf   RBC / HPF 3-6  <3 RBC/hpf   Bacteria, UA RARE  RARE  POCT PREGNANCY, URINE     Status: Abnormal   Collection Time    08/01/13  1:28 AM      Result Value Ref Range   Preg Test, Ur POSITIVE (*) NEGATIVE  WET PREP, GENITAL     Status: Abnormal   Collection Time    08/01/13  1:40 AM      Result Value Ref Range   Yeast Wet Prep HPF POC NONE SEEN  NONE SEEN   Trich, Wet Prep  NONE SEEN  NONE SEEN   Clue Cells Wet Prep HPF POC NONE SEEN  NONE SEEN   WBC, Wet Prep HPF POC MODERATE (*) NONE SEEN  CBC WITH DIFFERENTIAL     Status: Abnormal   Collection Time    08/01/13  1:58 AM      Result Value Ref Range   WBC 6.3  4.0 - 10.5 K/uL   RBC 3.62 (*) 3.87 - 5.11 MIL/uL   Hemoglobin 11.0 (*) 12.0 - 15.0 g/dL   HCT 40.9 (*) 81.1 - 91.4 %   MCV 88.7  78.0 - 100.0 fL   MCH 30.4  26.0 - 34.0 pg   MCHC 34.3  30.0 - 36.0 g/dL   RDW 78.2  95.6 - 21.3 %   Platelets 153  150 - 400 K/uL   Neutrophils Relative % 56  43 - 77 %   Neutro Abs 3.6  1.7 - 7.7 K/uL   Lymphocytes Relative 35  12 - 46 %   Lymphs Abs 2.2  0.7 - 4.0 K/uL   Monocytes Relative 7  3 - 12 %   Monocytes Absolute 0.4  0.1 - 1.0 K/uL   Eosinophils Relative 2  0 - 5 %   Eosinophils Absolute 0.1  0.0 - 0.7 K/uL   Basophils Relative 0  0 - 1 %   Basophils Absolute 0.0  0.0 - 0.1 K/uL  HCG, QUANTITATIVE, PREGNANCY     Status: Abnormal   Collection Time    08/01/13  1:58 AM      Result Value Ref Range   hCG, Beta Chain, Sharene Butters, [place] [address] (*) <5 mIU/mL    US Ob Comp Less 14 Wks  08/01/2013   CLINICAL DATA:  Vaginal bleeding and pregnancy  EXAM: OBSTETRIC <14 WK [place] AND TRANSVAGINAL OB US  TECHNIQUE: Both transabdominal and transvaginal ultrasound examinations were performed for complete evaluation of the gestation as well as the maternal uterus, adnexal regions, and pelvic cul-de-sac. Transvaginal technique was performed to assess early pregnancy.  COMPARISON:  01/26/2011  FINDINGS: Intrauterine gestational sac: Visualized/normal in shape.  Yolk sac:  Present  Embryo:  Present  Cardiac Activity: Present  Heart Rate:  163 bpm  CRL:   14  mm   7 w 5d                  [place] EDC: 03/15/2014  Maternal uterus/adnexae: No adnexal abnormality. No free pelvic fluid. There is  a subchorionic hemorrhage which is irregularly-shaped. Maximal diameter is 2 cm.  IMPRESSION: 1. Single living intrauterine gestation. Sonographic age is 7  weeks 5 days. 2. 2 cm subchronic hematoma.   Electronically Signed   By: Tiburcio PeaJonathan  Watts M.D.   On: 08/01/2013 03:52   [place] Ob Transvaginal  08/01/2013   CLINICAL DATA:  Vaginal bleeding and pregnancy  EXAM: OBSTETRIC <14 WK US AND TRANSVAGINAL OB US  TECHNIQUE: Both transabdominal and transvaginal ultrasound examinations were performed for complete evaluation of the gestation as well as the maternal uterus, adnexal regions, and pelvic cul-de-sac. Transvaginal technique was performed to assess early pregnancy.  COMPARISON:  01/26/2011  FINDINGS: Intrauterine gestational sac: Visualized/normal in shape.  Yolk sac:  Present  Embryo:  Present  Cardiac Activity: Present  Heart Rate:  163 bpm  CRL:   14  mm   7 w 5d                  [place] EDC: 03/15/2014  Maternal uterus/adnexae: No adnexal abnormality. No free pelvic fluid. There is a subchorionic hemorrhage which is irregularly-shaped. Maximal diameter is 2 cm.  IMPRESSION: 1. Single living intrauterine gestation. Sonographic age is 7 weeks 5 days. 2. 2 cm subchronic hematoma.   Electronically Signed   By: Tiburcio PeaJonathan  Watts M.D.   On: 08/01/2013 03:52    MAU Course  Procedures None  MDM +UPT  UA, wet prep, GC/Chlamydia, CBC, quant hCG and US today  Assessment and Plan  A: SIUP at 7552w6d with normal cardiac activity Subchorionic hemorrhage  P: Discharge home Bleeding precautions discussed Patient advised to call [facility] to start prenatal care Pregnancy confirmation letter given Patient may return to MAU as needed or if her condition were to change or worsen  Freddi StarrJulie N Ethier, PA-C  08/01/2013, 4:14 AM

## 2013-08-01 NOTE — Discharge Instructions (Signed)
Pelvic Rest °Pelvic rest is sometimes recommended for women when:  °· The placenta is partially or completely covering the opening of the cervix (placenta previa). °· There is bleeding between the uterine wall and the amniotic sac in the first trimester (subchorionic hemorrhage). °· The cervix begins to open without labor starting (incompetent cervix, cervical insufficiency). °· The labor is too early (preterm labor). °HOME CARE INSTRUCTIONS °· Do not have sexual intercourse, stimulation, or an orgasm. °· Do not use tampons, douche, or put anything in the vagina. °· Do not lift anything over 10 pounds (4.5 kg). °· Avoid strenuous activity or straining your pelvic muscles. °SEEK MEDICAL CARE IF:  °· You have any vaginal bleeding during pregnancy. Treat this as a potential emergency. °· You have cramping pain felt low in the stomach (stronger than menstrual cramps). °· You notice vaginal discharge (watery, mucus, or bloody). °· You have a low, dull backache. °· There are regular contractions or uterine tightening. °SEEK IMMEDIATE MEDICAL CARE IF: °You have vaginal bleeding and have placenta previa.  °Document Released: 04/29/2010 Document Revised: 03/27/2011 Document Reviewed: 04/29/2010 °ExitCare® Patient Information ©2015 [employer]. This information is not intended to replace advice given to you by your health care provider. Make sure you discuss any questions you have with your health care provider. °Subchorionic Hematoma °A subchorionic hematoma is a gathering of blood between the outer wall of the placenta and the inner wall of the womb (uterus). The placenta is the organ that connects the fetus to the wall of the uterus. The placenta performs the feeding, breathing (oxygen to the fetus), and waste removal (excretory work) of the fetus.  °Subchorionic hematoma is the most common abnormality found on a result from ultrasonography done during the first trimester or early second trimester of pregnancy. If there  has been little or no vaginal bleeding, early small hematomas usually shrink on their own and do not affect your baby or pregnancy. The blood is gradually absorbed over 1-2 weeks. When bleeding starts later in pregnancy or the hematoma is larger or occurs in an older pregnant woman, the outcome may not be as good. Larger hematomas may get bigger, which increases the chances for miscarriage. Subchorionic hematoma also increases the risk of premature detachment of the placenta from the uterus, preterm (premature) labor, and stillbirth. °HOME CARE INSTRUCTIONS  °· Stay on bed rest if your health care provider recommends this. Although bed rest will not prevent more bleeding or prevent a miscarriage, your health care provider may recommend bed rest until you are advised otherwise. °· Avoid heavy lifting (more than 10 lb [4.5 kg]), exercise, sexual intercourse, or douching as directed by your health care provider. °· Keep track of the number of pads you use each day and how soaked (saturated) they are. Write down this information. °· Do not use tampons. °· Keep all follow-up appointments as directed by your health care provider. Your health care provider may ask you to have follow-up blood tests or ultrasound tests or both. °SEEK IMMEDIATE MEDICAL CARE IF:  °· You have severe cramps in your stomach, back, abdomen, or pelvis. °· You have a fever. °· You pass large clots or tissue. Save any tissue for your health care provider to look at. °· Your bleeding increases or you become lightheaded, feel weak, or have fainting episodes. °Document Released: 04/19/2006 Document Revised: 10/23/2012 Document Reviewed: 08/01/2012 °ExitCare® Patient Information ©2015 [employer]. This information is not intended to replace advice given to you by your health care   provider. Make sure you discuss any questions you have with your health care provider. ° °

## 2013-08-01 NOTE — MAU Note (Signed)
PT SAYS  AT  12 MN- WENT TO B-ROOM  SHE SAW BLEEDING-  BROWN- IN UNDERWEAR  .  HPT- IN MARCH- POSITIVE.  5 TEST.        WENT TO HD  FOR OTHER BABIES.   NO CRAMPS BUT HER BACK HURTS.   IN TRIAGE - PAD ON   SMALL AMT  BROWN  BLEEDING.

## 2013-08-02 LAB — GC/CHLAMYDIA PROBE AMP
CT Probe RNA: NEGATIVE
GC Probe RNA: NEGATIVE

## 2013-08-06 NOTE — MAU Provider Note (Signed)
Attestation of Attending Supervision of Advanced Practitioner (CNM/NP): Evaluation and management procedures were performed by the Advanced Practitioner under my supervision and collaboration. I have reviewed the Advanced Practitioner's note and chart, and I agree with the management and plan.  Brityn Mastrogiovanni H. 9:48 AM

## 2013-09-04 ENCOUNTER — Other Ambulatory Visit (HOSPITAL_COMMUNITY): Payer: Self-pay | Admitting: Nurse Practitioner

## 2013-09-04 DIAGNOSIS — Z3682 Encounter for antenatal screening for nuchal translucency: Secondary | ICD-10-CM

## 2013-09-04 LAB — OB RESULTS CONSOLE HIV ANTIBODY (ROUTINE TESTING): HIV: NONREACTIVE

## 2013-09-04 LAB — OB RESULTS CONSOLE GC/CHLAMYDIA
Chlamydia: NEGATIVE
GC PROBE AMP, GENITAL: NEGATIVE

## 2013-09-04 LAB — OB RESULTS CONSOLE ABO/RH: RH Type: POSITIVE

## 2013-09-04 LAB — OB RESULTS CONSOLE RPR: RPR: NONREACTIVE

## 2013-09-04 LAB — OB RESULTS CONSOLE RUBELLA ANTIBODY, IGM: Rubella: IMMUNE

## 2013-09-04 LAB — OB RESULTS CONSOLE HEPATITIS B SURFACE ANTIGEN: Hepatitis B Surface Ag: NEGATIVE

## 2013-09-11 ENCOUNTER — Ambulatory Visit (HOSPITAL_COMMUNITY)
Admission: RE | Admit: 2013-09-11 | Discharge: 2013-09-11 | Disposition: A | Discharge status: Home / Self Care | Payer: Medicaid Other | Source: Ambulatory Visit | Attending: Nurse Practitioner | Admitting: Nurse Practitioner

## 2013-09-11 ENCOUNTER — Other Ambulatory Visit: Payer: Self-pay

## 2013-09-11 ENCOUNTER — Encounter (HOSPITAL_COMMUNITY): Payer: Self-pay

## 2013-09-11 VITALS — BP 90/47 | HR 88 | Wt 105.0 lb

## 2013-09-11 DIAGNOSIS — Z36 Encounter for antenatal screening of mother: Secondary | ICD-10-CM | POA: Diagnosis present

## 2013-09-11 DIAGNOSIS — Z3682 Encounter for antenatal screening for nuchal translucency: Secondary | ICD-10-CM

## 2013-10-13 ENCOUNTER — Other Ambulatory Visit (HOSPITAL_COMMUNITY): Payer: Self-pay | Admitting: Nurse Practitioner

## 2013-10-13 DIAGNOSIS — Z3689 Encounter for other specified antenatal screening: Secondary | ICD-10-CM

## 2013-10-22 ENCOUNTER — Other Ambulatory Visit (HOSPITAL_COMMUNITY): Payer: Medicaid Other

## 2013-10-22 ENCOUNTER — Ambulatory Visit (HOSPITAL_COMMUNITY)
Admission: RE | Admit: 2013-10-22 | Discharge: 2013-10-22 | Disposition: A | Discharge status: Home / Self Care | Payer: Medicaid Other | Source: Ambulatory Visit | Attending: Nurse Practitioner | Admitting: Nurse Practitioner

## 2013-10-22 DIAGNOSIS — Z36 Encounter for antenatal screening of mother: Secondary | ICD-10-CM | POA: Diagnosis not present

## 2013-10-22 DIAGNOSIS — Z3A19 19 weeks gestation of pregnancy: Secondary | ICD-10-CM | POA: Insufficient documentation

## 2013-10-22 DIAGNOSIS — Z3689 Encounter for other specified antenatal screening: Secondary | ICD-10-CM

## 2013-11-17 ENCOUNTER — Encounter (HOSPITAL_COMMUNITY): Payer: Self-pay

## 2013-12-13 ENCOUNTER — Emergency Department (HOSPITAL_COMMUNITY)
Admission: EM | Admit: 2013-12-13 | Discharge: 2013-12-13 | Disposition: A | Discharge status: Home / Self Care | Payer: Medicaid Other | Attending: Emergency Medicine | Admitting: Emergency Medicine

## 2013-12-13 DIAGNOSIS — O99513 Diseases of the respiratory system complicating pregnancy, third trimester: Secondary | ICD-10-CM | POA: Diagnosis present

## 2013-12-13 DIAGNOSIS — Z79899 Other long term (current) drug therapy: Secondary | ICD-10-CM | POA: Diagnosis not present

## 2013-12-13 DIAGNOSIS — D649 Anemia, unspecified: Secondary | ICD-10-CM | POA: Insufficient documentation

## 2013-12-13 DIAGNOSIS — O99013 Anemia complicating pregnancy, third trimester: Secondary | ICD-10-CM | POA: Insufficient documentation

## 2013-12-13 DIAGNOSIS — O9989 Other specified diseases and conditions complicating pregnancy, childbirth and the puerperium: Secondary | ICD-10-CM | POA: Diagnosis not present

## 2013-12-13 DIAGNOSIS — Z3A28 28 weeks gestation of pregnancy: Secondary | ICD-10-CM | POA: Insufficient documentation

## 2013-12-13 DIAGNOSIS — H9209 Otalgia, unspecified ear: Secondary | ICD-10-CM | POA: Insufficient documentation

## 2013-12-13 DIAGNOSIS — J40 Bronchitis, not specified as acute or chronic: Secondary | ICD-10-CM

## 2013-12-13 DIAGNOSIS — J209 Acute bronchitis, unspecified: Secondary | ICD-10-CM | POA: Insufficient documentation

## 2013-12-13 MED ORDER — AMOXICILLIN 500 MG PO CAPS: 1000.0000 mg | ORAL_CAPSULE | Freq: Two times a day (BID) | ORAL | Status: DC

## 2013-12-13 MED ORDER — AMOXICILLIN 500 MG PO CAPS
1000.0000 mg | ORAL_CAPSULE | Freq: Once | ORAL | Status: AC
  Administered 2013-12-13: 1000 mg via ORAL
  Filled 2013-12-13: qty 2

## 2013-12-13 NOTE — ED Provider Notes (Signed)
CSN: [id number]     Arrival date & time 12/13/13  1830 History  This chart was scribed for non-physician practitioner working with Layla MawKristen N Ward, DO by Elveria Risingimelie Horne, ED Scribe. This patient was seen in room WTR9/WTR9 and the patient's care was started at 9:01 PM.   Chief Complaint  Patient presents with  . Fever  . Pregnant   . Cough   The history is provided by the patient. No language interpreter was used.   HPI Comments: Shannon Hernandez is a 23 y.o. female who is currently 7 months pregnant presents to the Emergency Department complaining of an array of cold symptoms including fever, congestion, rhinorrhea, cough, myalgias, and ear pain ongoing for five days. Patient reports maximum temperature of 101F at home. Patient reports several sick contacts at home stating it is going around. Patient denies complications with her pregnancy including abdominal pain, vomiting, vaginal bleeding. Patient endorses prenatal care, but does not take any medications regularly.  Past Medical History  Diagnosis Date  . Anemia   . Abnormal Pap smear     ASCUS  . Choroid plexus cysts, fetal, affecting care of mother, antepartum    No past surgical history on file. Family History  Problem Relation Age of Onset  . Asthma Brother   . Diabetes Paternal Grandfather    History  Substance Use Topics  . Smoking status: Never Smoker   . Smokeless tobacco: Never Used  . Alcohol Use: No   OB History    Gravida Para Term Preterm AB TAB SAB Ectopic Multiple Living   4 3 3       3      Review of Systems  Constitutional: Negative for fever and chills.  HENT: Positive for congestion, ear pain and rhinorrhea. Negative for sore throat.   Respiratory: Positive for cough.   Gastrointestinal: Negative for abdominal pain.  Musculoskeletal: Positive for myalgias.    Allergies  Review of patient's allergies indicates no known allergies.  Home Medications   Prior to Admission medications   Medication Sig  Start Date End Date Taking? Authorizing Provider  loratadine (CLARITIN) 10 MG tablet Take 10 mg by mouth daily.    Historical Provider, MD  Multiple Vitamin (MULTIVITAMIN) tablet Take 1 tablet by mouth daily.    Historical Provider, MD   Triage Vitals: BP 105/72 mmHg  Pulse 103  Temp(Src) 99.4 F (37.4 C) (Oral)  Resp 18  SpO2 100%  LMP 06/07/2013 Physical Exam  Constitutional: She is oriented to person, place, and time. She appears well-developed and well-nourished. No distress.  HENT:  Head: Normocephalic and atraumatic.  Nasal mucosa edematous. oropharagyn benign. Post nasal drainage noted.   Eyes: EOM are normal.  Neck: Neck supple. No tracheal deviation present.  Cardiovascular: Normal rate and regular rhythm.   No murmur heard. Not tachycardic.   Pulmonary/Chest: Effort normal. No respiratory distress.  Scattered rhonchi. Full air movement. No wheezing.   Musculoskeletal: Normal range of motion.  Neurological: She is alert and oriented to person, place, and time.  Skin: Skin is warm and dry.  Psychiatric: She has a normal mood and affect. Her behavior is normal.  Nursing note and vitals reviewed.   ED Course  Procedures (including critical care time)  COORDINATION OF CARE: 9:05 PM- Will prescribe antibiotic. Patient advised to treat rhinorrhea with nasal saline spray. Discussed treatment plan with patient at bedside and patient agreed to plan.   Labs Review Labs Reviewed - No data to display  Imaging Review  No results found.   EKG Interpretation None      MDM   Final diagnoses:  None    1. Sinusitis  Duration of symptoms for one week with reported fever, worsening symptoms currently. Will cover with abx, safe for use in pregnancy. Encouraged PCP follow up prn.  I personally performed the services described in this documentation, which was scribed in my presence. The recorded information has been reviewed and is accurate.    Arnoldo HookerShari A Laira Penninger,  PA-C 12/14/13 0117  Layla MawKristen N Ward, DO 12/14/13 2126

## 2013-12-13 NOTE — Discharge Instructions (Signed)

## 2013-12-13 NOTE — ED Notes (Signed)
Patient has not taken any medications for the cold symptoms, so they have gotten worse. She was not aware that she could take tylenol as needed.

## 2013-12-13 NOTE — ED Notes (Addendum)
Pt states she is 7 months pregnant.  Has been having fever, cough, body aches, otalgia, since Monday.  No contractions or trouble with pregnancy.

## 2014-01-05 ENCOUNTER — Other Ambulatory Visit (HOSPITAL_COMMUNITY): Payer: Self-pay | Admitting: Nurse Practitioner

## 2014-01-05 DIAGNOSIS — IMO0002 Reserved for concepts with insufficient information to code with codable children: Secondary | ICD-10-CM

## 2014-01-14 ENCOUNTER — Ambulatory Visit (HOSPITAL_COMMUNITY)
Admission: RE | Admit: 2014-01-14 | Discharge: 2014-01-14 | Disposition: A | Discharge status: Home / Self Care | Payer: Medicaid Other | Source: Ambulatory Visit | Attending: Nurse Practitioner | Admitting: Nurse Practitioner

## 2014-01-14 DIAGNOSIS — Z3A31 31 weeks gestation of pregnancy: Secondary | ICD-10-CM | POA: Diagnosis not present

## 2014-01-14 DIAGNOSIS — O26843 Uterine size-date discrepancy, third trimester: Secondary | ICD-10-CM | POA: Insufficient documentation

## 2014-01-14 DIAGNOSIS — IMO0002 Reserved for concepts with insufficient information to code with codable children: Secondary | ICD-10-CM

## 2014-01-14 DIAGNOSIS — O26849 Uterine size-date discrepancy, unspecified trimester: Secondary | ICD-10-CM | POA: Insufficient documentation

## 2014-01-16 NOTE — L&D Delivery Note (Addendum)
Patient is 24 y.o. [id number] 7448w0d admitted with SROM and augmented with pitocin, no significant prenatal hx   Delivery Note At 5:56 PM a viable female was delivered via Vaginal, Spontaneous Delivery (Presentation: ;  ).  APGAR: 9, 9; weight 6 lb 14.4 oz (3130 g).   Placenta status: Intact, Spontaneous.  Cord: 3 vessels with the following complications: None.  Anesthesia: None  Episiotomy: None Lacerations: None Suture Repair: n/a Est. Blood Loss (mL):  450mL  Methergine 0.2 given IM 2/2 increased bleeding shortly after delivery before placenta delivered, in and out cath with ~8450mL, fundus firm on exam tilted right.  Mom to postpartum.  Baby to Couplet care / Skin to Skin.  Shannon Hernandez 02/21/2014, 6:35 PM

## 2014-01-23 LAB — OB RESULTS CONSOLE HIV ANTIBODY (ROUTINE TESTING): HIV: NONREACTIVE

## 2014-02-21 ENCOUNTER — Inpatient Hospital Stay (HOSPITAL_COMMUNITY)
Admission: AD | Admit: 2014-02-21 | Discharge: 2014-02-22 | DRG: 775 | Disposition: A | Discharge status: Home / Self Care | Payer: Medicaid Other | Source: Ambulatory Visit | Attending: Obstetrics & Gynecology | Admitting: Obstetrics & Gynecology

## 2014-02-21 ENCOUNTER — Encounter (HOSPITAL_COMMUNITY): Payer: Self-pay | Admitting: *Deleted

## 2014-02-21 DIAGNOSIS — Z833 Family history of diabetes mellitus: Secondary | ICD-10-CM

## 2014-02-21 DIAGNOSIS — IMO0001 Reserved for inherently not codable concepts without codable children: Secondary | ICD-10-CM

## 2014-02-21 DIAGNOSIS — Z3A37 37 weeks gestation of pregnancy: Secondary | ICD-10-CM | POA: Diagnosis present

## 2014-02-21 LAB — CBC
HCT: 30.9 % — ABNORMAL LOW (ref 36.0–46.0)
Hemoglobin: 10.4 g/dL — ABNORMAL LOW (ref 12.0–15.0)
MCH: 30.3 pg (ref 26.0–34.0)
MCHC: 33.7 g/dL (ref 30.0–36.0)
MCV: 90.1 fL (ref 78.0–100.0)
Platelets: 187 10*3/uL (ref 150–400)
RBC: 3.43 MIL/uL — AB (ref 3.87–5.11)
RDW: 14.8 % (ref 11.5–15.5)
WBC: 9.5 10*3/uL (ref 4.0–10.5)

## 2014-02-21 LAB — TYPE AND SCREEN
ABO/RH(D): B POS
ANTIBODY SCREEN: NEGATIVE

## 2014-02-21 LAB — OB RESULTS CONSOLE GBS: GBS: NEGATIVE

## 2014-02-21 LAB — GROUP B STREP BY PCR: Group B strep by PCR: NEGATIVE

## 2014-02-21 LAB — POCT FERN TEST

## 2014-02-21 MED ORDER — OXYTOCIN 40 UNITS IN LACTATED RINGERS INFUSION - SIMPLE MED: 62.5000 mL/h | INTRAVENOUS | Status: DC | PRN

## 2014-02-21 MED ORDER — IBUPROFEN 600 MG PO TABS
600.0000 mg | ORAL_TABLET | Freq: Four times a day (QID) | ORAL | Status: DC
  Administered 2014-02-21 – 2014-02-22 (×5): 600 mg via ORAL
  Filled 2014-02-21 (×5): qty 1

## 2014-02-21 MED ORDER — FENTANYL CITRATE 0.05 MG/ML IJ SOLN
100.0000 ug | INTRAMUSCULAR | Status: DC | PRN
  Administered 2014-02-21 (×2): 100 ug via INTRAVENOUS
  Filled 2014-02-21 (×2): qty 2

## 2014-02-21 MED ORDER — TERBUTALINE SULFATE 1 MG/ML IJ SOLN
0.2500 mg | Freq: Once | INTRAMUSCULAR | Status: DC | PRN
Start: 2014-02-21 — End: 2014-02-21
  Filled 2014-02-21: qty 1

## 2014-02-21 MED ORDER — SODIUM CHLORIDE 0.9 % IJ SOLN: 3.0000 mL | Freq: Two times a day (BID) | INTRAMUSCULAR | Status: DC

## 2014-02-21 MED ORDER — LANOLIN HYDROUS EX OINT: TOPICAL_OINTMENT | CUTANEOUS | Status: DC | PRN

## 2014-02-21 MED ORDER — ONDANSETRON HCL 4 MG/2ML IJ SOLN: 4.0000 mg | Freq: Four times a day (QID) | INTRAMUSCULAR | Status: DC | PRN

## 2014-02-21 MED ORDER — ONDANSETRON HCL 4 MG PO TABS: 4.0000 mg | ORAL_TABLET | ORAL | Status: DC | PRN

## 2014-02-21 MED ORDER — LACTATED RINGERS IV SOLN
INTRAVENOUS | Status: DC
  Administered 2014-02-21: 13:00:00 via INTRAVENOUS

## 2014-02-21 MED ORDER — METHYLERGONOVINE MALEATE 0.2 MG/ML IJ SOLN
INTRAMUSCULAR | Status: AC
  Filled 2014-02-21: qty 1

## 2014-02-21 MED ORDER — SENNOSIDES-DOCUSATE SODIUM 8.6-50 MG PO TABS
2.0000 | ORAL_TABLET | ORAL | Status: DC
  Filled 2014-02-21: qty 2

## 2014-02-21 MED ORDER — ZOLPIDEM TARTRATE 5 MG PO TABS: 5.0000 mg | ORAL_TABLET | Freq: Every evening | ORAL | Status: DC | PRN

## 2014-02-21 MED ORDER — SIMETHICONE 80 MG PO CHEW: 80.0000 mg | CHEWABLE_TABLET | ORAL | Status: DC | PRN

## 2014-02-21 MED ORDER — LACTATED RINGERS IV SOLN: 500.0000 mL | INTRAVENOUS | Status: DC | PRN

## 2014-02-21 MED ORDER — BENZOCAINE-MENTHOL 20-0.5 % EX AERO: 1.0000 "application " | INHALATION_SPRAY | CUTANEOUS | Status: DC | PRN

## 2014-02-21 MED ORDER — FLEET ENEMA 7-19 GM/118ML RE ENEM: 1.0000 | ENEMA | Freq: Every day | RECTAL | Status: DC | PRN

## 2014-02-21 MED ORDER — LIDOCAINE HCL (PF) 1 % IJ SOLN
30.0000 mL | INTRAMUSCULAR | Status: DC | PRN
  Filled 2014-02-21: qty 30

## 2014-02-21 MED ORDER — SODIUM CHLORIDE 0.9 % IV SOLN: 250.0000 mL | INTRAVENOUS | Status: DC | PRN

## 2014-02-21 MED ORDER — SODIUM CHLORIDE 0.9 % IJ SOLN: 3.0000 mL | INTRAMUSCULAR | Status: DC | PRN

## 2014-02-21 MED ORDER — OXYTOCIN 40 UNITS IN LACTATED RINGERS INFUSION - SIMPLE MED
62.5000 mL/h | INTRAVENOUS | Status: DC
  Administered 2014-02-21: 62.5 mL/h via INTRAVENOUS

## 2014-02-21 MED ORDER — OXYCODONE-ACETAMINOPHEN 5-325 MG PO TABS: 1.0000 | ORAL_TABLET | ORAL | Status: DC | PRN

## 2014-02-21 MED ORDER — METHYLERGONOVINE MALEATE 0.2 MG/ML IJ SOLN
0.2000 mg | Freq: Once | INTRAMUSCULAR | Status: AC
  Administered 2014-02-21: 0.2 mg via INTRAMUSCULAR

## 2014-02-21 MED ORDER — OXYCODONE-ACETAMINOPHEN 5-325 MG PO TABS: 2.0000 | ORAL_TABLET | ORAL | Status: DC | PRN

## 2014-02-21 MED ORDER — WITCH HAZEL-GLYCERIN EX PADS: 1.0000 "application " | MEDICATED_PAD | CUTANEOUS | Status: DC | PRN

## 2014-02-21 MED ORDER — ONDANSETRON HCL 4 MG/2ML IJ SOLN: 4.0000 mg | INTRAMUSCULAR | Status: DC | PRN

## 2014-02-21 MED ORDER — DIBUCAINE 1 % RE OINT: 1.0000 "application " | TOPICAL_OINTMENT | RECTAL | Status: DC | PRN

## 2014-02-21 MED ORDER — BISACODYL 10 MG RE SUPP: 10.0000 mg | Freq: Every day | RECTAL | Status: DC | PRN

## 2014-02-21 MED ORDER — PRENATAL MULTIVITAMIN CH
1.0000 | ORAL_TABLET | Freq: Every day | ORAL | Status: DC
  Administered 2014-02-22: 1 via ORAL
  Filled 2014-02-21: qty 1

## 2014-02-21 MED ORDER — OXYTOCIN 40 UNITS IN LACTATED RINGERS INFUSION - SIMPLE MED
1.0000 m[IU]/min | INTRAVENOUS | Status: DC
  Administered 2014-02-21: 2 m[IU]/min via INTRAVENOUS
  Filled 2014-02-21: qty 1000

## 2014-02-21 MED ORDER — ACETAMINOPHEN 325 MG PO TABS
650.0000 mg | ORAL_TABLET | Freq: Four times a day (QID) | ORAL | Status: DC | PRN
  Administered 2014-02-21: 650 mg via ORAL
  Filled 2014-02-21: qty 2

## 2014-02-21 MED ORDER — DIPHENHYDRAMINE HCL 25 MG PO CAPS: 25.0000 mg | ORAL_CAPSULE | Freq: Four times a day (QID) | ORAL | Status: DC | PRN

## 2014-02-21 MED ORDER — CITRIC ACID-SODIUM CITRATE 334-500 MG/5ML PO SOLN: 30.0000 mL | ORAL | Status: DC | PRN

## 2014-02-21 MED ORDER — ACETAMINOPHEN 325 MG PO TABS: 650.0000 mg | ORAL_TABLET | ORAL | Status: DC | PRN

## 2014-02-21 MED ORDER — FLEET ENEMA 7-19 GM/118ML RE ENEM: 1.0000 | ENEMA | RECTAL | Status: DC | PRN

## 2014-02-21 MED ORDER — OXYTOCIN BOLUS FROM INFUSION: 500.0000 mL | INTRAVENOUS | Status: DC

## 2014-02-21 NOTE — Progress Notes (Signed)
Dr. Rachelle HoraMoss notified of pt going to room 163 for SROM.

## 2014-02-21 NOTE — MAU Note (Signed)
Pt states rom at 1200, pink tinged fluid. Contractions q5 minutes apart.

## 2014-02-21 NOTE — H&P (Signed)
Earnstine Josie Dixon Roberge is a 24 y.o.  24 y.o. [id number] 4345w0d presents for SROM and active labor. SROM @ 1200 02/21/14. Actively contracting about every 5 mins. No complications of pregnancy. Baby moving well and no bleeding.   History OB History    Gravida Para Term Preterm AB TAB SAB Ectopic Multiple Living   4 3 3       3      Past Medical History  Diagnosis Date  . Anemia   . Abnormal Pap smear     ASCUS  . Choroid plexus cysts, fetal, affecting care of mother, antepartum    No past surgical history on file. Family History: family history includes Asthma in her brother; Diabetes in her paternal grandfather. Social History:  reports that she has never smoked. She has never used smokeless tobacco. She reports that she does not drink alcohol or use illicit drugs.  ROS Neg except for HPI   Blood pressure 109/71, pulse 98, temperature 98.4 F (36.9 C), temperature source Oral, resp. rate 18, height 5\' 6"  (1.676 m), weight 57.607 kg (127 lb), last menstrual period 06/07/2013. Maternal Exam:  Uterine Assessment: Contraction strength is moderate.  Contraction duration is 3 minutes. Contraction frequency is regular.      Fetal Exam Fetal Monitor Review: Baseline rate: 145.  Variability: moderate (6-25 bpm).   Pattern: accelerations present and no decelerations.    Fetal State Assessment: Category I - tracings are normal.     Physical Exam  Constitutional: She is oriented to person, place, and time. She appears well-developed and well-nourished.  Mild distress   HENT:  Head: Normocephalic and atraumatic.  Respiratory: Effort normal.  Genitourinary:  Cervix: 5/80/-2  Neurological: She is alert and oriented to person, place, and time.  Skin: Skin is warm and dry.  Psychiatric: She has a normal mood and affect. Her behavior is normal. Thought content normal.    Prenatal labs: ABO, Rh:  B, pos Antibody:  neg Rubella:  neg RPR:   neg HBsAg:   neg HIV: Non-reactive (01/08 0000)   GBS:   unknown at time of admission  Assessment/Plan: Patient is 24 y.o. [id number] 345w0d presents for SROM and active labor  # active labor- expectant management  # pain management- no pain meds, labor support  # ID- GBS unknown, GBS pcr  # MOF- breast and bottle  # MOC- declines contraception  # baby girl    Rolm BookbinderMoss, Amber 02/21/2014, 1:29 PM    OB fellow attestation:  I have seen and examined this patient; I agree with above documentation in the resident's note.   Sybil Josie Dixon Losier is a 24 y.o. [id number] here for rupture of membranes  PE: BP 114/65 mmHg  Pulse 89  Temp(Src) 98.5 F (36.9 C) (Oral)  Resp 20  Ht 5\' 6"  (1.676 m)  Wt 127 lb (57.607 kg)  BMI 20.51 kg/m2  LMP 06/07/2013 Gen: calm comfortable, NAD Resp: normal effort, no distress Abd: gravid  ROS, labs, PMH reviewed  Plan: Agree with plan above GBS pcr, not on HD records likely has been done but results not available for review at this time If no cervical change at next check => pit  Rainee Sweatt ROCIO 02/21/2014, 3:10 PM

## 2014-02-21 NOTE — Lactation Note (Signed)
This note was copied from the chart of Shannon Joelie Lockner. Lactation Consultation Note  Patient Name: Shannon Hernandez: 02/21/2014 Reason for consult: Initial assessment Mom is experienced BF. Reports baby is nursing well. Denies question or concerns. Lactation brochure left for review, advised of OP services and support group. Encouraged to call for questions/concerns or assist if needed.   Maternal Data Has patient been taught Hand Expression?: Yes Does the patient have breastfeeding experience prior to this delivery?: Yes  Feeding Feeding Type: Breast Fed Length of feed: 20 min (per mom report)  LATCH Score/Interventions Latch: Grasps breast easily, tongue down, lips flanged, rhythmical sucking.  Audible Swallowing: None  Type of Nipple: Everted at rest and after stimulation  Comfort (Breast/Nipple): Soft / non-tender     Hold (Positioning): No assistance needed to correctly position infant at breast.  LATCH Score: 8  Lactation Tools Discussed/Used WIC Program: Yes   Consult Status Consult Status: Follow-up Hernandez: 02/22/14 Follow-up type: In-patient    Alfred LevinsGranger, Deontaye Civello Ann 02/21/2014, 11:02 PM

## 2014-02-21 NOTE — Progress Notes (Signed)
Hermina Josie Dixon Forlenza is a 24 y.o. G4P3003 at 732w0d admitted for active labor, rupture of membranes  Subjective: Pt feeling more pressure and urge to push.  Objective: BP 113/74 mmHg  Pulse 108  Temp(Src) 98.2 F (36.8 C) (Oral)  Resp 20  Ht 5\' 6"  (1.676 m)  Wt 57.607 kg (127 lb)  BMI 20.51 kg/m2  LMP 06/07/2013      FHT:  FHR: 145 bpm, variability: moderate,  accelerations:  Present,  decelerations:  Absent UC:   regular, every 2-3 minutes SVE:   Dilation: 6.5 Effacement (%): 100 Station: -2, -1 Exam by:: dr Loreta Aveacosta  Labs: Lab Results  Component Value Date   WBC 9.5 02/21/2014   HGB 10.4* 02/21/2014   HCT 30.9* 02/21/2014   MCV 90.1 02/21/2014   PLT 187 02/21/2014    Assessment / Plan: Spontaneous labor, progressing normally  Labor: Progressing normally Preeclampsia:  no signs or symptoms of toxicity Fetal Wellbeing:  Category I Pain Control:  Fentanyl I/D:  gbs neg Anticipated MOD:  NSVD  Rolm BookbinderMoss, Detrice Cales 02/21/2014, 4:27 PM

## 2014-02-22 LAB — RPR: RPR Ser Ql: NONREACTIVE

## 2014-02-22 NOTE — Discharge Summary (Signed)
Obstetric Discharge Summary Reason for Admission: onset of labor Prenatal Procedures: none Intrapartum Procedures: spontaneous vaginal delivery Postpartum Procedures: none Complications-Operative and Postpartum: none HEMOGLOBIN  Date Value Ref Range Status  02/21/2014 10.4* 12.0 - 15.0 g/dL Final   HCT  Date Value Ref Range Status  02/21/2014 30.9* 36.0 - 46.0 % Final   Patient is 24 y.o. [id number] 502w0d admitted with SROM and augmented with pitocin, no significant prenatal hx.  Today doing well, pain well controlled, breast feeding and no complaints.  Declines Birth control.   Delivery Note At 5:56 PM a viable female was delivered via Vaginal, Spontaneous Delivery (Presentation: ;  ).  APGAR: 9, 9; weight 6 lb 14.4 oz (3130 g).   Placenta status: Intact, Spontaneous.  Cord: 3 vessels with the following complications: None.  Anesthesia: None  Episiotomy: None Lacerations: None Suture Repair: n/a Est. Blood Loss (mL):  450mL  Methergine 0.2 given IM 2/2 increased bleeding shortly after delivery before placenta delivered, in and out cath with ~87950mL, fundus firm on exam tilted right.  Mom to postpartum.  Baby to Couplet care / Skin to Skin.  Shannon Hernandez 02/21/2014, 6:35 PM     Physical Exam:  General: alert and cooperative Lochia: appropriate Uterine Fundus: firm DVT Evaluation: No evidence of DVT seen on physical exam.  Discharge Diagnoses: Term Pregnancy-delivered  Discharge Information: Date: 02/22/2014 Activity: pelvic rest Diet: routine Medications: None Condition: stable Instructions: refer to practice specific booklet Discharge to: home Follow-up Information    [facility]   Specialty:  Home Health Services   Contact information:   [facility] [address] [place] [address] [phone]       Newborn Data: Live born female  Birth Weight: 6 lb 14.4 oz  (3130 g) APGAR: 9, 9  Home with mother.  Shannon Hernandez 02/22/2014, 7:20 AM

## 2014-02-22 NOTE — Lactation Note (Addendum)
This note was copied from the chart of Shannon Hernandez. Lactation Consultation Note: Follow up visit with this experienced BF mom. She reports baby is latching well with a little pain just at the beginning of the feeding- eases off after a few minutes. Nipples tender but intact. Last LS 9 by RN. Encouraged to rub EBM into nipples after feeding. Reports breasts are feeling fuller this morning. No questions at present. To call prn  Patient Name: Shannon Hernandez ZOXWR'UToday's Date: 02/22/2014 Reason for consult: Follow-up assessment   Maternal Data Formula Feeding for Exclusion: No Has patient been taught Hand Expression?: Yes Does the patient have breastfeeding experience prior to this delivery?: Yes  Feeding Feeding Type: Breast Fed  LATCH Score/Interventions  Lactation Tools Discussed/Used     Consult Status Consult Status: Complete    Shannon Hernandez, Shannon Hernandez 02/22/2014, 11:51 AM

## 2014-02-22 NOTE — Discharge Instructions (Signed)

## 2014-02-23 ENCOUNTER — Ambulatory Visit: Payer: Self-pay

## 2014-02-23 NOTE — Lactation Note (Signed)
This note was copied from the chart of Shannon Guinevere Newville. Lactation Consultation Note     Baby now 6541 hours old, breast feeding well as per mom, mom reports her milk is transitioning in, and denies any questions. Pediatrician visit set for 2/10. Mom knows tl call lactation for questions/concerns.  Patient Name: Shannon Hernandez Date: 02/23/2014 Reason for consult: Follow-up assessment   Maternal Data    Feeding    LATCH Score/Interventions                      Lactation Tools Discussed/Used     Consult Status Consult Status: Complete Follow-up type: Call as needed    Alfred LevinsLee, Kimbely Whiteaker Anne 02/23/2014, 11:10 AM

## 2014-02-23 NOTE — Progress Notes (Signed)
Post discharge chart review completed.  

## 2015-01-17 NOTE — L&D Delivery Note (Addendum)
Delivery Note At 12:22 PM a viable female was delivered vaginally without complication  (Presentation: cephalic ROA;).  APGAR: 8,9.   Placenta status: delivered with minimal traction.Cord: 3 vessel without complications:    Anesthesia:  Fentanyl Episiotomy:  NA Lacerations:  NA Suture Repair: NA Est. Blood Loss (mL):  200 ml  Mom to postpartum.  Baby to Couplet care / Skin to Skin.  All instruments and gauze accounted for and all sharps disposed of.  Andres Ege, MD, PGY-1, MPH 08/11/2015, 12:41 PM   OB FELLOW DELIVERY ATTESTATION  I was gloved and present for the delivery in its entirety, and I agree with the above resident's note.    Jen Mow, DO 3:04 PM

## 2015-04-01 LAB — OB RESULTS CONSOLE ABO/RH: RH TYPE: POSITIVE

## 2015-04-01 LAB — OB RESULTS CONSOLE GC/CHLAMYDIA
Chlamydia: NEGATIVE
GC PROBE AMP, GENITAL: NEGATIVE

## 2015-04-01 LAB — OB RESULTS CONSOLE RUBELLA ANTIBODY, IGM: RUBELLA: IMMUNE

## 2015-04-01 LAB — OB RESULTS CONSOLE HEPATITIS B SURFACE ANTIGEN: HEP B S AG: NEGATIVE

## 2015-04-01 LAB — OB RESULTS CONSOLE RPR: RPR: NONREACTIVE

## 2015-04-01 LAB — OB RESULTS CONSOLE HIV ANTIBODY (ROUTINE TESTING): HIV: NONREACTIVE

## 2015-07-10 IMAGING — US US OB COMP LESS 14 WK
1 series · 14 of 28 positions shown · non-contrast
Comparison: 01/26/2011

CLINICAL DATA: Vaginal bleeding and pregnancy

EXAM:
OBSTETRIC <14 WK US AND TRANSVAGINAL OB US
TECHNIQUE: Both transabdominal and transvaginal ultrasound examinations were
performed for complete evaluation of the gestation as well as the
maternal uterus, adnexal regions, and pelvic cul-de-sac.
Transvaginal technique was performed to assess early pregnancy.

[Series 1: us ob comp less 14 wks · 14 of 45 slices shown]
[im 2/45]
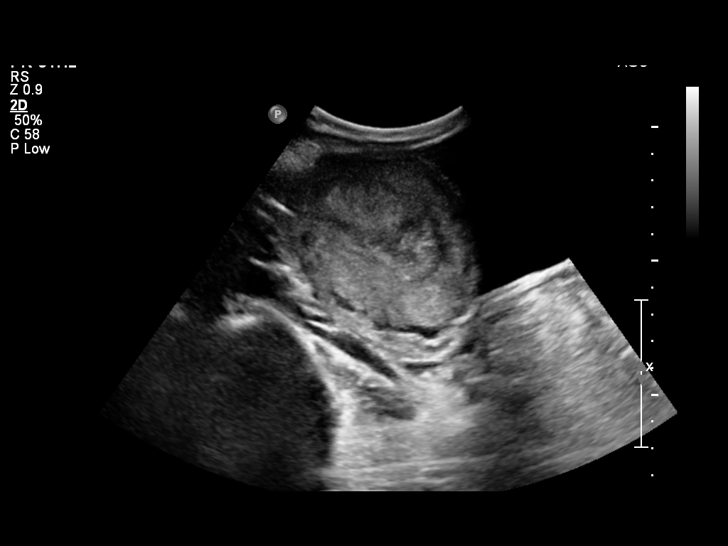
[im 5/45]
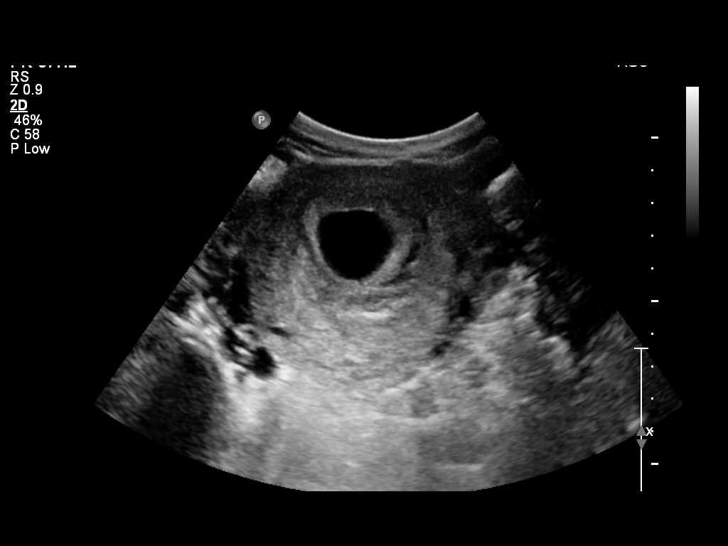
[im 9/45]
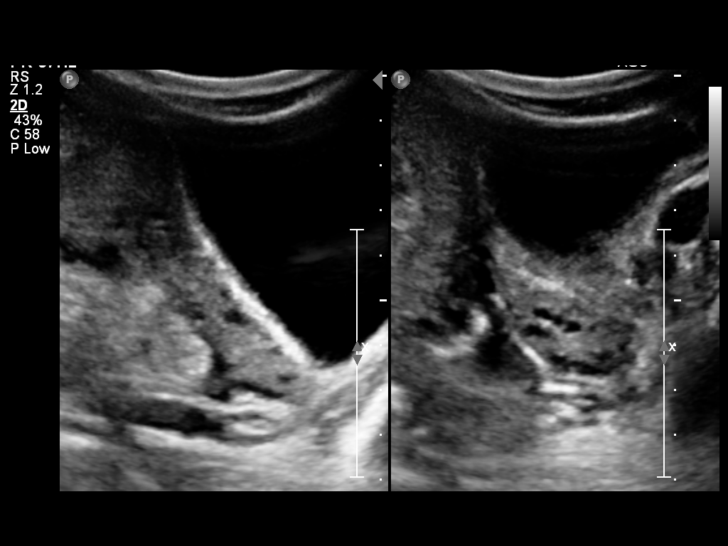
[im 12/45]
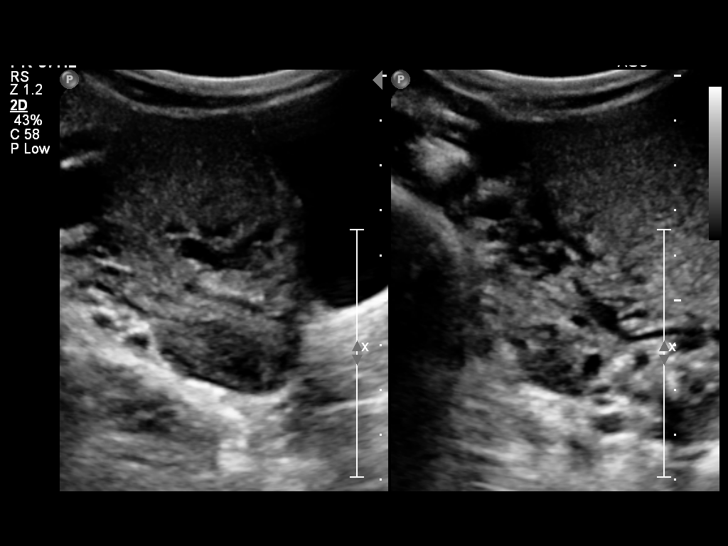
[im 15/45]
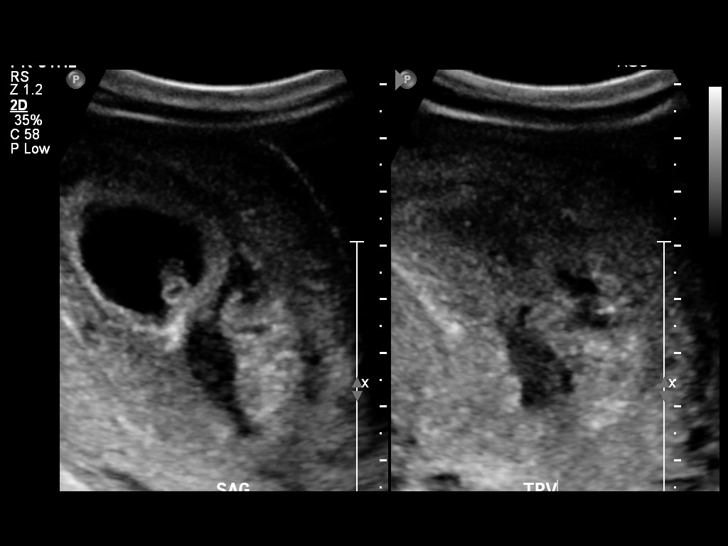
[im 18/45]
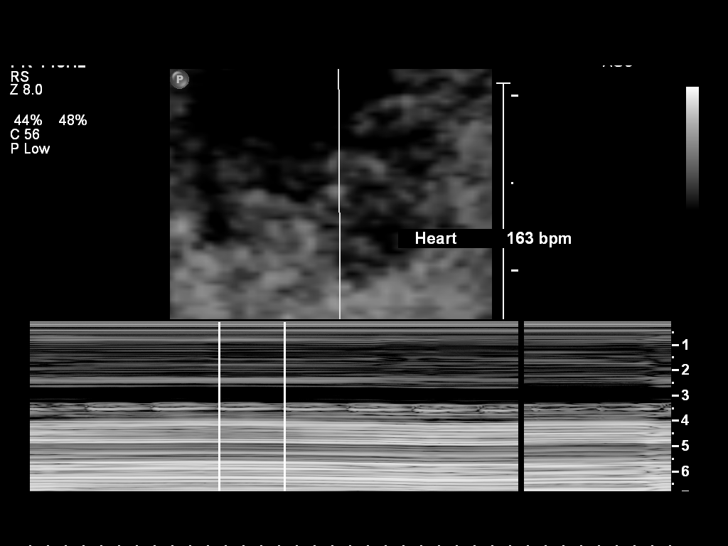
[im 22/45]
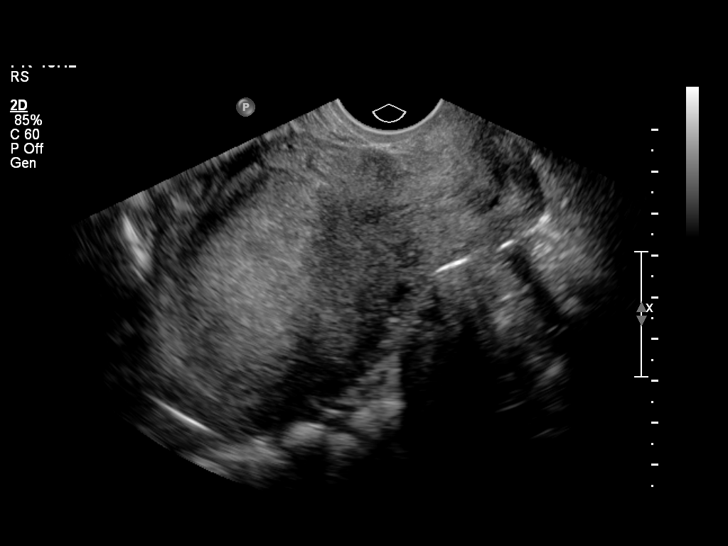
[im 25/45]
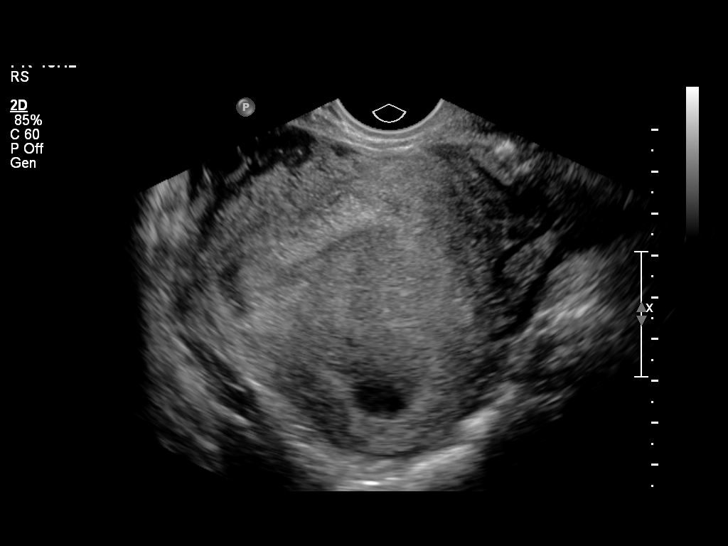
[im 28/45]
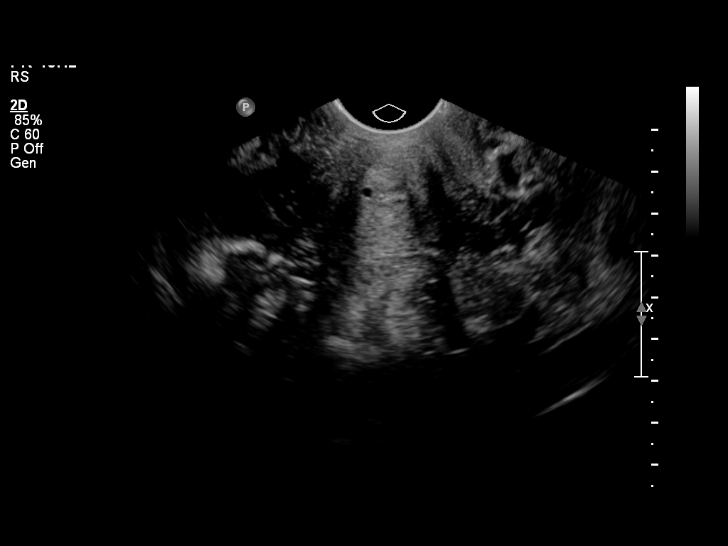
[im 31/45]
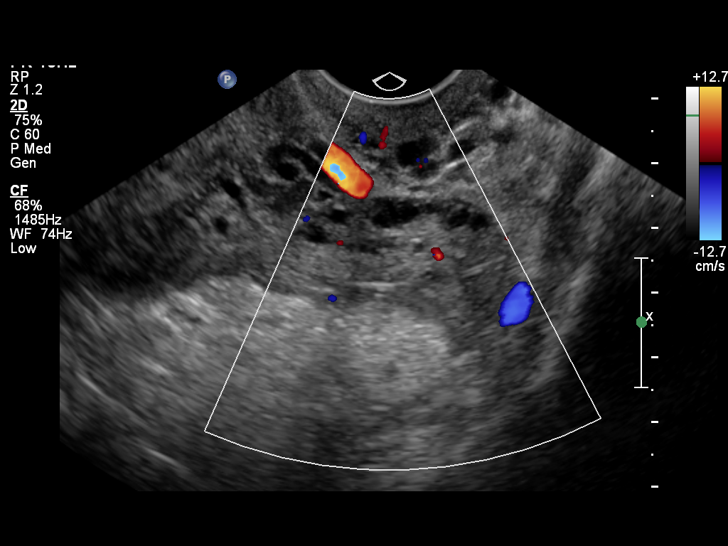
[im 35/45]
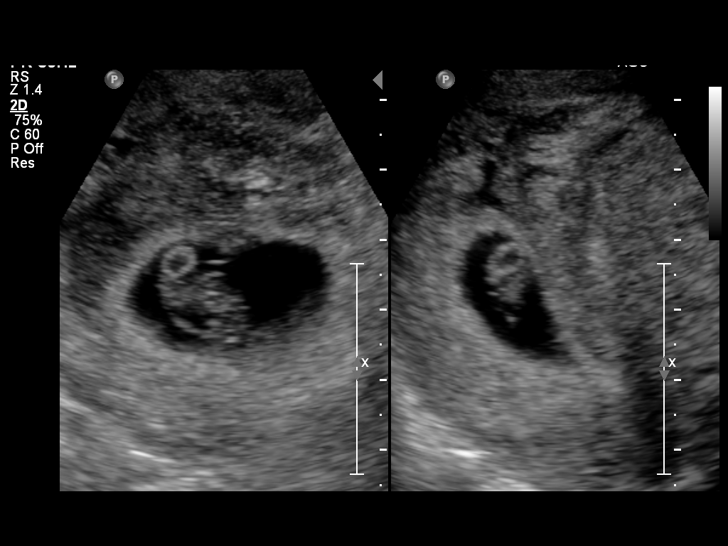
[im 38/45]
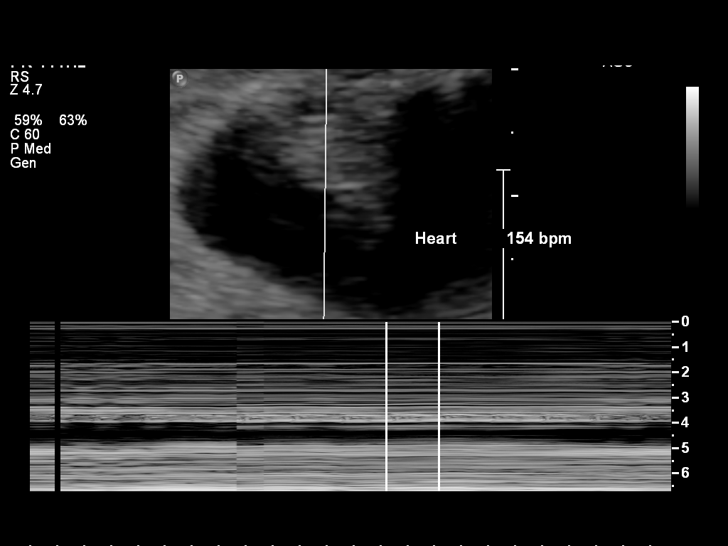
[im 41/45]
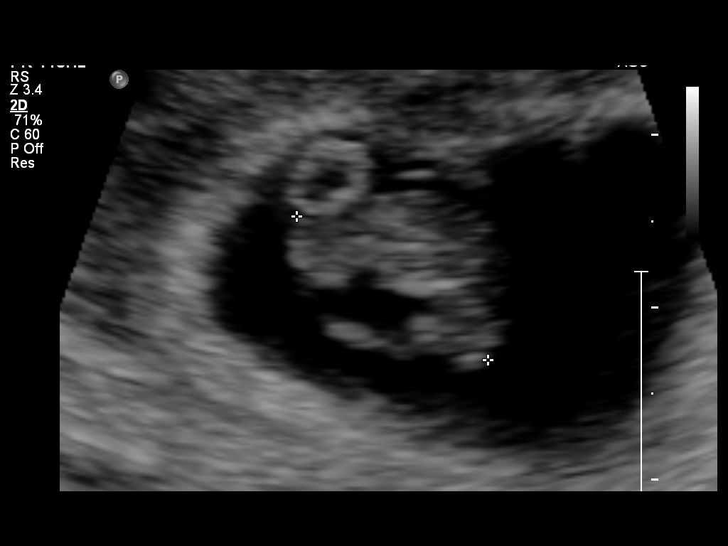
[im 45/45]
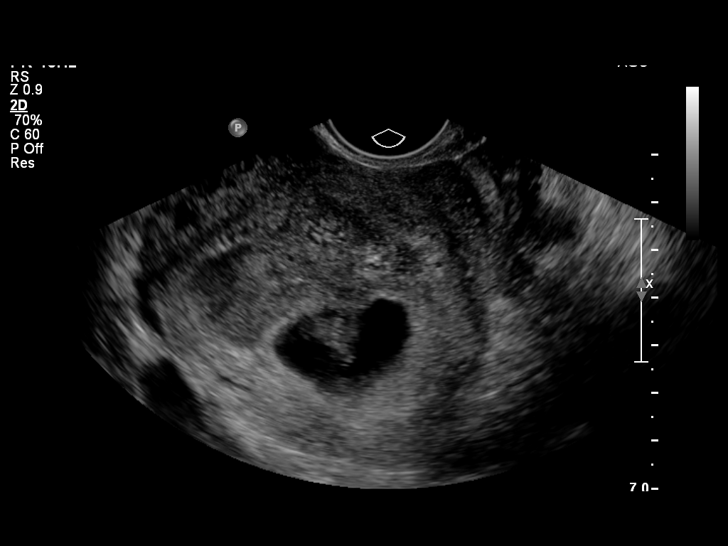

[14 of 28 positions shown; findings below may reference images not displayed]

FINDINGS: Intrauterine gestational sac: Visualized/normal in shape.

Yolk sac:  Present

Embryo:  Present

Cardiac Activity: Present

Heart Rate:  163 bpm

CRL:   14  mm   7 w 5d                  US EDC: 03/15/2014

Maternal uterus/adnexae: No adnexal abnormality. No free pelvic
fluid. There is a subchorionic hemorrhage which is
irregularly-shaped. Maximal diameter is 2 cm.
IMPRESSION: 1. Single living intrauterine gestation. Sonographic age is 7 weeks
5 days.
2. 2 cm subchronic hematoma.

## 2015-07-26 LAB — OB RESULTS CONSOLE GC/CHLAMYDIA
CHLAMYDIA, DNA PROBE: NEGATIVE
Gonorrhea: NEGATIVE

## 2015-07-26 LAB — OB RESULTS CONSOLE GBS: STREP GROUP B AG: POSITIVE

## 2015-08-11 ENCOUNTER — Inpatient Hospital Stay (HOSPITAL_COMMUNITY)
Admission: AD | Admit: 2015-08-11 | Discharge: 2015-08-13 | DRG: 775 | Disposition: A | Discharge status: Home / Self Care | Payer: Medicaid Other | Source: Ambulatory Visit | Attending: Obstetrics and Gynecology | Admitting: Obstetrics and Gynecology

## 2015-08-11 ENCOUNTER — Encounter (HOSPITAL_COMMUNITY): Payer: Self-pay | Admitting: *Deleted

## 2015-08-11 DIAGNOSIS — Z825 Family history of asthma and other chronic lower respiratory diseases: Secondary | ICD-10-CM

## 2015-08-11 DIAGNOSIS — Z833 Family history of diabetes mellitus: Secondary | ICD-10-CM

## 2015-08-11 DIAGNOSIS — Z3483 Encounter for supervision of other normal pregnancy, third trimester: Secondary | ICD-10-CM | POA: Diagnosis present

## 2015-08-11 DIAGNOSIS — Z3A38 38 weeks gestation of pregnancy: Secondary | ICD-10-CM

## 2015-08-11 DIAGNOSIS — IMO0001 Reserved for inherently not codable concepts without codable children: Secondary | ICD-10-CM

## 2015-08-11 DIAGNOSIS — O99824 Streptococcus B carrier state complicating childbirth: Principal | ICD-10-CM | POA: Diagnosis present

## 2015-08-11 LAB — CBC
HCT: 29.5 % — ABNORMAL LOW (ref 36.0–46.0)
HEMOGLOBIN: 9.7 g/dL — AB (ref 12.0–15.0)
MCH: 26.1 pg (ref 26.0–34.0)
MCHC: 32.9 g/dL (ref 30.0–36.0)
MCV: 79.3 fL (ref 78.0–100.0)
PLATELETS: 212 10*3/uL (ref 150–400)
RBC: 3.72 MIL/uL — AB (ref 3.87–5.11)
RDW: 14.3 % (ref 11.5–15.5)
WBC: 8.3 10*3/uL (ref 4.0–10.5)

## 2015-08-11 LAB — TYPE AND SCREEN
ABO/RH(D): B POS
ANTIBODY SCREEN: NEGATIVE

## 2015-08-11 MED ORDER — ZOLPIDEM TARTRATE 5 MG PO TABS: 5.0000 mg | ORAL_TABLET | Freq: Every evening | ORAL | Status: DC | PRN

## 2015-08-11 MED ORDER — ONDANSETRON HCL 4 MG/2ML IJ SOLN: 4.0000 mg | INTRAMUSCULAR | Status: DC | PRN

## 2015-08-11 MED ORDER — FENTANYL CITRATE (PF) 100 MCG/2ML IJ SOLN
100.0000 ug | INTRAMUSCULAR | Status: DC | PRN
  Administered 2015-08-11: 100 ug via INTRAVENOUS

## 2015-08-11 MED ORDER — PRENATAL MULTIVITAMIN CH
1.0000 | ORAL_TABLET | Freq: Every day | ORAL | Status: DC
  Administered 2015-08-11 – 2015-08-13 (×3): 1 via ORAL
  Filled 2015-08-11 (×3): qty 1

## 2015-08-11 MED ORDER — ACETAMINOPHEN 325 MG PO TABS
650.0000 mg | ORAL_TABLET | ORAL | Status: DC | PRN
  Administered 2015-08-13: 650 mg via ORAL

## 2015-08-11 MED ORDER — LIDOCAINE HCL (PF) 1 % IJ SOLN
30.0000 mL | INTRAMUSCULAR | Status: DC | PRN
  Filled 2015-08-11 (×2): qty 30

## 2015-08-11 MED ORDER — OXYCODONE-ACETAMINOPHEN 5-325 MG PO TABS: 2.0000 | ORAL_TABLET | ORAL | Status: DC | PRN

## 2015-08-11 MED ORDER — ACETAMINOPHEN 325 MG PO TABS
650.0000 mg | ORAL_TABLET | ORAL | Status: DC | PRN
  Administered 2015-08-13: 650 mg via ORAL
  Filled 2015-08-11 (×2): qty 2

## 2015-08-11 MED ORDER — SIMETHICONE 80 MG PO CHEW: 80.0000 mg | CHEWABLE_TABLET | ORAL | Status: DC | PRN

## 2015-08-11 MED ORDER — ONDANSETRON HCL 4 MG/2ML IJ SOLN: 4.0000 mg | Freq: Four times a day (QID) | INTRAMUSCULAR | Status: DC | PRN

## 2015-08-11 MED ORDER — BENZOCAINE-MENTHOL 20-0.5 % EX AERO
1.0000 "application " | INHALATION_SPRAY | CUTANEOUS | Status: DC | PRN
  Administered 2015-08-11: 1 via TOPICAL
  Filled 2015-08-11: qty 56

## 2015-08-11 MED ORDER — ONDANSETRON HCL 4 MG PO TABS: 4.0000 mg | ORAL_TABLET | ORAL | Status: DC | PRN

## 2015-08-11 MED ORDER — OXYCODONE-ACETAMINOPHEN 5-325 MG PO TABS: 1.0000 | ORAL_TABLET | ORAL | Status: DC | PRN

## 2015-08-11 MED ORDER — IBUPROFEN 600 MG PO TABS
600.0000 mg | ORAL_TABLET | Freq: Four times a day (QID) | ORAL | Status: DC
  Administered 2015-08-11 – 2015-08-13 (×10): 600 mg via ORAL
  Filled 2015-08-11 (×10): qty 1

## 2015-08-11 MED ORDER — OXYTOCIN BOLUS FROM INFUSION
500.0000 mL | Freq: Once | INTRAVENOUS | Status: AC
  Administered 2015-08-11: 500 mL via INTRAVENOUS

## 2015-08-11 MED ORDER — LACTATED RINGERS IV SOLN: 500.0000 mL | INTRAVENOUS | Status: DC | PRN

## 2015-08-11 MED ORDER — FENTANYL CITRATE (PF) 100 MCG/2ML IJ SOLN
50.0000 ug | INTRAMUSCULAR | Status: DC | PRN
  Filled 2015-08-11: qty 2

## 2015-08-11 MED ORDER — DIBUCAINE 1 % RE OINT: 1.0000 "application " | TOPICAL_OINTMENT | RECTAL | Status: DC | PRN

## 2015-08-11 MED ORDER — OXYTOCIN 40 UNITS IN LACTATED RINGERS INFUSION - SIMPLE MED
2.5000 [IU]/h | INTRAVENOUS | Status: DC
  Filled 2015-08-11 (×2): qty 1000

## 2015-08-11 MED ORDER — DIPHENHYDRAMINE HCL 25 MG PO CAPS: 25.0000 mg | ORAL_CAPSULE | Freq: Four times a day (QID) | ORAL | Status: DC | PRN

## 2015-08-11 MED ORDER — COCONUT OIL OIL: 1.0000 "application " | TOPICAL_OIL | Status: DC | PRN

## 2015-08-11 MED ORDER — SODIUM CHLORIDE 0.9 % IV SOLN
2.0000 g | Freq: Once | INTRAVENOUS | Status: AC
  Administered 2015-08-11: 2 g via INTRAVENOUS
  Filled 2015-08-11: qty 2000

## 2015-08-11 MED ORDER — WITCH HAZEL-GLYCERIN EX PADS: 1.0000 "application " | MEDICATED_PAD | CUTANEOUS | Status: DC | PRN

## 2015-08-11 MED ORDER — LACTATED RINGERS IV SOLN
INTRAVENOUS | Status: DC
  Administered 2015-08-11: 10:00:00 via INTRAVENOUS

## 2015-08-11 MED ORDER — SOD CITRATE-CITRIC ACID 500-334 MG/5ML PO SOLN: 30.0000 mL | ORAL | Status: DC | PRN

## 2015-08-11 MED ORDER — SENNOSIDES-DOCUSATE SODIUM 8.6-50 MG PO TABS
2.0000 | ORAL_TABLET | ORAL | Status: DC
  Administered 2015-08-11 – 2015-08-12 (×2): 2 via ORAL
  Filled 2015-08-11 (×2): qty 2

## 2015-08-11 MED ORDER — TETANUS-DIPHTH-ACELL PERTUSSIS 5-2.5-18.5 LF-MCG/0.5 IM SUSP: 0.5000 mL | Freq: Once | INTRAMUSCULAR | Status: DC

## 2015-08-11 NOTE — MAU Note (Signed)
C/o ucs since Sunday; ucs got stronger yesterday around 1400; no vaginal bleeding or vaginal leaking;

## 2015-08-11 NOTE — MAU Note (Signed)
Has been having contractions since Sunday.  Woke her around 0230, more intense. Now every 5-3min. No bleeding or leaking.   Denies any problems with pregnancy. Was 3 when last checked.

## 2015-08-11 NOTE — H&P (Addendum)
LABOR AND DELIVERY ADMISSION HISTORY AND PHYSICAL NOTE  Shannon Hernandez is a 25 y.o. female [id number] with IUP at [redacted]w[redacted]d presenting for SOL. Came to MAU ~ 4 hours ago because of ctx that started Sun, woke up this morning to more regular ctx and decided to come in  She reports positive fetal movement. She denies vaginal bleeding, reports water broke this morning.  Prenatal History/Complications:  Past Medical History: Past Medical History:  Diagnosis Date  . Abnormal Pap smear    ASCUS  . Anemia   . Choroid plexus cysts, fetal, affecting care of mother, antepartum     Past Surgical History: Past Surgical History:  Procedure Laterality Date  . NO PAST SURGERIES      Obstetrical History: OB History    Gravida Para Term Preterm AB Living   5 4 4     4    SAB TAB Ectopic Multiple Live Births         0        Social History: Social History   Social History  . Marital status: Married    Spouse name: N/A  . Number of children: N/A  . Years of education: N/A   Social History Main Topics  . Smoking status: Never Smoker  . Smokeless tobacco: Never Used  . Alcohol use No  . Drug use: No  . Sexual activity: Yes    Birth control/ protection: Pill   Other Topics Concern  . None   Social History Narrative  . None    Family History: Family History  Problem Relation Age of Onset  . Asthma Brother   . Diabetes Paternal Grandfather     Allergies: No Known Allergies  Prescriptions Prior to Admission  Medication Sig Dispense Refill Last Dose  . Prenatal Vit-Fe Fumarate-FA (PRENATAL MULTIVITAMIN) TABS tablet Take 1 tablet by mouth daily at 12 noon.   08/10/2015 at Unknown time     Review of Systems   All systems reviewed and negative except as stated in HPI +HA since yesterday (did not take anything for it) -vision changes, -N/V  Blood pressure 109/64, pulse 70, temperature 97.5 F (36.4 C), resp. rate 17, height 5\' 6"  (1.676 m), weight 128 lb 12 oz (58.4 kg),  unknown if currently breastfeeding.   General appearance: alert, cooperative and no distress Lungs: clear to auscultation bilaterally Heart: regular rate and rhythm Abdomen: soft, non-tender Extremities: No calf swelling or tenderness Presentation: cephalic Fetal monitoring:  Fetal Heart Rate A  Mode External filed at 08/11/2015 0930  Baseline Rate (A) 140 bpm filed at 08/11/2015 0858  Variability 6-25 BPM filed at 08/11/2015 0858  Accelerations 15 x 15 filed at 08/11/2015 0858  Decelerations None filed at 08/11/2015 8416   Uterine activity: Mod ctx, 1-9 min aprt, 30-70 sec Dilation: 7 Effacement (%): 80 Station: -1 Exam by:: Morrison Old RN   Prenatal labs: ABO, Rh: B/Positive/-- (03/16 0000) Antibody:   Rubella: !Error! RPR: Nonreactive (03/16 0000)  HBsAg: Negative (03/16 0000)  HIV: Non-reactive (03/16 0000)  GBS: Positive (07/10 0000)  1 hr Glucola: 59 Genetic screening:  NA Anatomy US: Nl  Prenatal Transfer Tool  Maternal Diabetes: No Genetic Screening: Declined Maternal Ultrasounds/Referrals: Normal Fetal Ultrasounds or other Referrals:  None Maternal Substance Abuse:  No Significant Maternal Medications:  None Significant Maternal Lab Results:  Results for orders placed or performed during the hospital encounter of 08/11/15 (from the past 24 hour(s))  CBC     Status: Abnormal   Collection  Time: 08/11/15  9:50 AM  Result Value Ref Range   WBC 8.3 4.0 - 10.5 K/uL   RBC 3.72 (L) 3.87 - 5.11 MIL/uL   Hemoglobin 9.7 (L) 12.0 - 15.0 g/dL   HCT 40.9 (L) 81.1 - 91.4 %   MCV 79.3 78.0 - 100.0 fL   MCH 26.1 26.0 - 34.0 pg   MCHC 32.9 30.0 - 36.0 g/dL   RDW 78.2 95.6 - 21.3 %   Platelets 212 150 - 400 K/uL     Results for orders placed or performed during the hospital encounter of 08/11/15 (from the past 24 hour(s))  CBC   Collection Time: 08/11/15  9:50 AM  Result Value Ref Range   WBC 8.3 4.0 - 10.5 K/uL   RBC 3.72 (L) 3.87 - 5.11 MIL/uL   Hemoglobin 9.7  (L) 12.0 - 15.0 g/dL   HCT 08.6 (L) 57.8 - 46.9 %   MCV 79.3 78.0 - 100.0 fL   MCH 26.1 26.0 - 34.0 pg   MCHC 32.9 30.0 - 36.0 g/dL   RDW 62.9 52.8 - 41.3 %   Platelets 212 150 - 400 K/uL    Patient Active Problem List   Diagnosis Date Noted  . Active labor at term 08/11/2015  . Seasonal allergies 12/13/2011  . Annual physical exam 12/13/2011  . Underweight 12/13/2011    Assessment: Shannon Hernandez is a healthy 26 y.o. K4M0102 at [redacted]w[redacted]d [facility] here for SOL and labor management.  #Labor: Expectant management #Pain: Fentanyl #FWB: Cat 1 #ID:  GBS pos, ampicillin 2g #MOF: both B&B #MOC: OCP #Circ:  Girl  Andres Ege, MD, PGY-1, MPH 08/11/2015, 10:42 AM  OB FELLOW HISTORY AND PHYSICAL ATTESTATION  I have seen and examined this patient; I agree with above documentation in the resident's note.    Jen Mow, DO 08/11/2015, 3:02 PM

## 2015-08-11 NOTE — Lactation Note (Signed)
This note was copied from a baby's chart. Lactation Consultation Note  Patient Name: Shannon Hernandez Date: 08/11/2015 Reason for consult: Initial assessment Baby at 8 hr of life and mom was taking baby off the breast upon entry. Baby was still cueing and began to cry so mom offered pacifier. Tried to discussed artificial nipples with mom but stated the baby was full. She bf her oldest 9 months and all the other children for 6 months. In the room there was a 44 month old crying, a 27yr old running in circles, along with a 5 and 27 yr old jumping on the couch throwing animal crackers at the 82 m old. Mom may need more education later. Mom did report bleeding nipples. Her L nipple does have a bright red sore on the nipple face, but the R nipple appears normal. Given comfort gels. She stated she can manually express and has spoon in room. Discussed feeding frequency, voids, wt loss, breast changes, and nipple care. Given lactation handouts. Aware of OP services and support group.   Maternal Data    Feeding Feeding Type: Breast Fed Length of feed: 20 min  LATCH Score/Interventions                      Lactation Tools Discussed/Used WIC Program: Yes   Consult Status Consult Status: Follow-up Date: 08/12/15 Follow-up type: In-patient    [name] 08/11/2015, 8:48 PM

## 2015-08-12 LAB — RPR: RPR: NONREACTIVE

## 2015-08-12 NOTE — Progress Notes (Signed)
Post Partum Day 1  Subjective:  Ritta E Graven is a 25 y.o. F2T2446 [redacted]w[redacted]d s/p SVD.  No acute events overnight.  Pt denies problems with ambulating, voiding or po intake.  She denies nausea or vomiting.  Pain is well controlled.  She has had flatus. She has had bowel movement.  Lochia Minimal.  Plan for birth control is oral progesterone-only contraceptive.  Method of Feeding: bottle and maybe breast  Objective: BP (!) 95/54   Pulse 61   Temp 97.8 F (36.6 C) (Oral)   Resp 16   Ht 5\' 6"  (1.676 m)   Wt 58.1 kg (128 lb)   SpO2 100%   Breastfeeding? Unknown   BMI 20.66 kg/m   Physical Exam:  General: alert, cooperative and no distress Lochia:normal flow Chest: CTAB Heart: RRR no m/r/g Abdomen: +BS, soft, nontender Uterine Fundus: firm, appropriately tender, below umbilicus DVT Evaluation: No evidence of DVT seen on physical exam. Negative Homan's sign Extremities: Trace edema   Recent Labs  08/11/15 0950  HGB 9.7*  HCT 29.5*    Assessment/Plan:  ASSESSMENT: Maggi E Bucks is a 25 y.o. [id number] [redacted]w[redacted]d ppd #1 s/p NSVD doing well.   Plan for discharge tomorrow, Breastfeeding and Contraception POPs  If breastfeeding does not work, will switch to bottle feeding.     LOS: 1 day   Clyde Canterbury 08/12/2015, 7:42 AM   Evaluation and management procedures were performed by PA under my supervision/collaboration. Chart reviewed, patient examined by me and I agree with management and plan. See my note.

## 2015-08-12 NOTE — Progress Notes (Signed)
Post Partum Day #1 NSVD Subjective: no complaints, up ad lib, voiding, tolerating PO and + flatus. Plans POPs.  Considering breastfeeding but not yet initiated.  Objective: Blood pressure (!) 95/54, pulse 61, temperature 97.8 F (36.6 C), temperature source Oral, resp. rate 16, height 5\' 6"  (1.676 m), weight 58.1 kg (128 lb), SpO2 100 %, unknown if currently breastfeeding.  Physical Exam:  General: alert, cooperative and no distress Lochia: appropriate Uterine Fundus: firm Incision: N/A DVT Evaluation: No evidence of DVT seen on physical exam.   Recent Labs  08/11/15 0950  HGB 9.7*  HCT 29.5*    Assessment/Plan:  Plan for discharge tomorrow. See LC if decides to breastfeed.    LOS: 1 day   Raena Pau 08/12/2015, 8:44 AM

## 2015-08-13 MED ORDER — ACETAMINOPHEN 325 MG PO TABS: 650.0000 mg | ORAL_TABLET | ORAL | 0 refills | Status: DC | PRN

## 2015-08-13 NOTE — Lactation Note (Signed)
This note was copied from a baby's chart. Lactation Consultation Note Experienced BF mom of 4 other children states BF going well. Has no questions or concerns. Planning on going home today. States doesn't need LC to come by before d/c home. Discussed I&O, engorgement, management, prevention, STS, and supply and demand. Reviewed OP services if needed.  Patient Name: Shannon Hernandez [id number] Date: 08/13/2015 Reason for consult: Follow-up assessment   Maternal Data    Feeding Feeding Type: Breast Fed Length of feed: 20 min  LATCH Score/Interventions Latch: Grasps breast easily, tongue down, lips flanged, rhythmical sucking.  Audible Swallowing: Spontaneous and intermittent Intervention(s): Skin to skin;Hand expression Intervention(s): Alternate breast massage  Type of Nipple: Everted at rest and after stimulation  Comfort (Breast/Nipple): Soft / non-tender  Interventions (Mild/moderate discomfort): Hand massage;Hand expression  Hold (Positioning): No assistance needed to correctly position infant at breast.  LATCH Score: 10  Lactation Tools Discussed/Used     Consult Status Consult Status: Complete Date: 08/13/15    Charyl Dancer 08/13/2015, 6:23 AM

## 2015-08-13 NOTE — Discharge Instructions (Signed)

## 2015-08-13 NOTE — Discharge Summary (Signed)
    OB Discharge Summary     Patient Name: Shannon Hernandez DOB: December 21, 1990 MRN: [id number]  Date of admission: 08/11/2015 Delivering MD: Andres Ege R   Date of discharge: 08/13/2015  Admitting diagnosis: 40 WEEKS CTX Intrauterine pregnancy: [redacted]w[redacted]d     Secondary diagnosis:  Active Problems:   Active labor at term  Additional problems: none     Discharge diagnosis: Term Pregnancy Delivered                                                                                                Post partum procedures:none  Augmentation: none  Complications: None  Hospital course:  Onset of Labor With Vaginal Delivery     25 y.o. yo G5P5005 at [redacted]w[redacted]d was admitted in Active Labor on 08/11/2015. Patient had an uncomplicated labor course as follows:  Membrane Rupture Time/Date: 12:12 PM ,08/11/2015   Intrapartum Procedures: Episiotomy: None [1]                                         Lacerations:  None [1]  Patient had a delivery of a Viable infant. 08/11/2015  Information for the patient's newborn:  Lashira, Defaria Girl Breyona [[id number]]  Delivery Method: Vag-Spont    Pateint had an uncomplicated postpartum course.  She is ambulating, tolerating a regular diet, passing flatus, and urinating well. Patient is discharged home in stable condition on 08/13/15.    Physical exam Vitals:   08/11/15 1912 08/12/15 0616 08/12/15 1800 08/13/15 0704  BP: (!) 88/57 (!) 95/54 102/63 102/72  Pulse: 65 61 62 66  Resp: 18 16 18 18   Temp: 98 F (36.7 C) 97.8 F (36.6 C) 98 F (36.7 C) 97.9 F (36.6 C)  TempSrc: Oral Oral Oral Oral  SpO2: 100%     Weight:      Height:       General: alert, cooperative and no distress Lochia: appropriate Uterine Fundus: firm Incision: Healing well with no significant drainage DVT Evaluation: No evidence of DVT seen on physical exam. No significant calf/ankle edema. Labs: Lab Results  Component Value Date   WBC 8.3 08/11/2015   HGB 9.7 (L) 08/11/2015   HCT 29.5 (L)  08/11/2015   MCV 79.3 08/11/2015   PLT 212 08/11/2015   No flowsheet data found.  Discharge instruction: per After Visit Summary and "Baby and Me Booklet".  After visit meds:    Medication List    ASK your doctor about these medications   prenatal multivitamin Tabs tablet Take 1 tablet by mouth daily at 12 noon.       Diet: routine diet  Activity: Advance as tolerated. Pelvic rest for 6 weeks.   Outpatient follow up:6 weeks Follow up Appt:No future appointments. Follow up Visit:No Follow-up on file.  Postpartum contraception: Progesterone only pills  Newborn Data: Live born female  Birth Weight: 7 lb 0.7 oz (3195 g) APGAR: 8, 9  Baby Feeding: Breast Disposition:home with mother   08/13/2015 Ernestina Penna, MD

## 2015-08-20 IMAGING — US US MFM FETAL NUCHAL TRANSLUCENCY
1 series · 13 of 27 positions shown · non-contrast
Comparison: none

[Series 1: us mfm fetal nuchal translucency · 0.19mm/px · 13 of 27 slices shown]
[im 2/27]
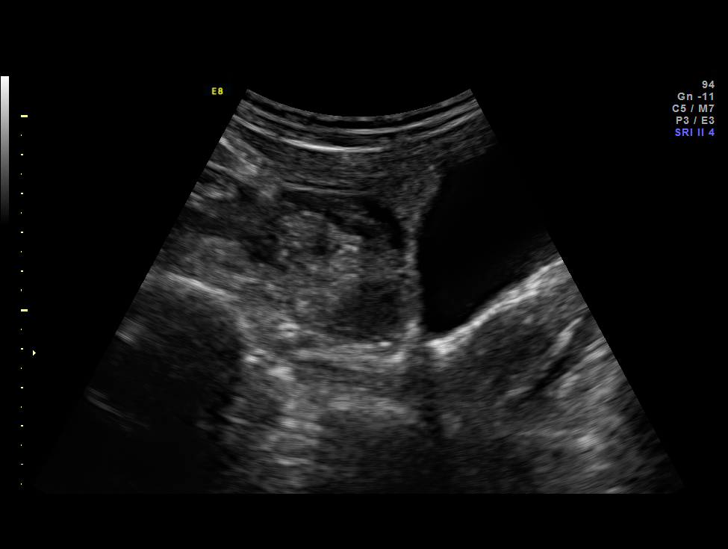
[im 4/27]
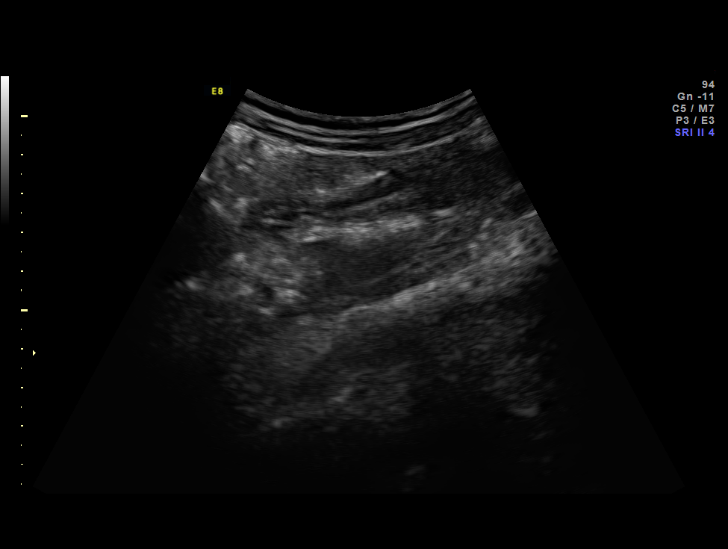
[im 6/27]
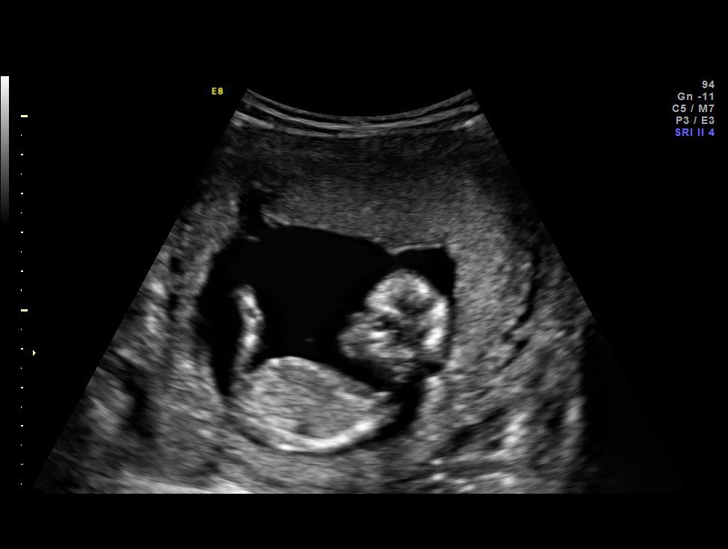
[im 8/27]
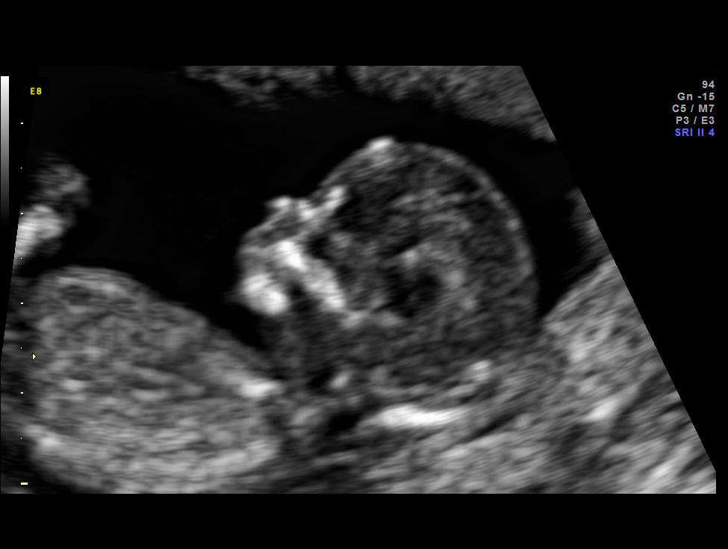
[im 10/27]
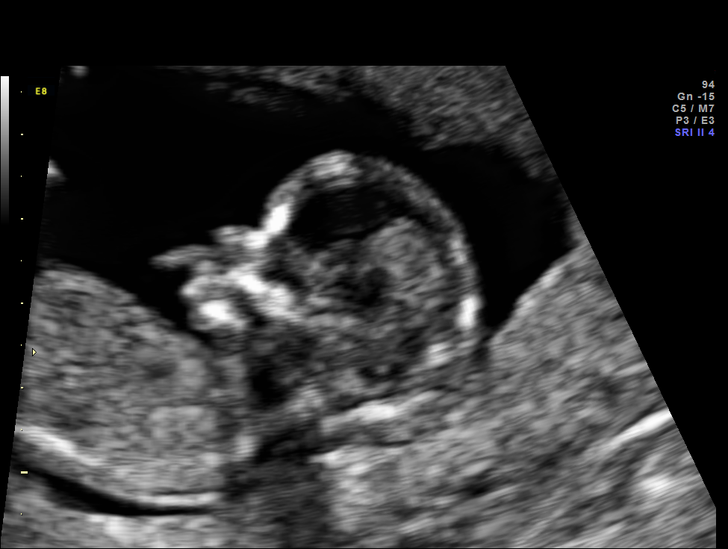
[im 12/27]
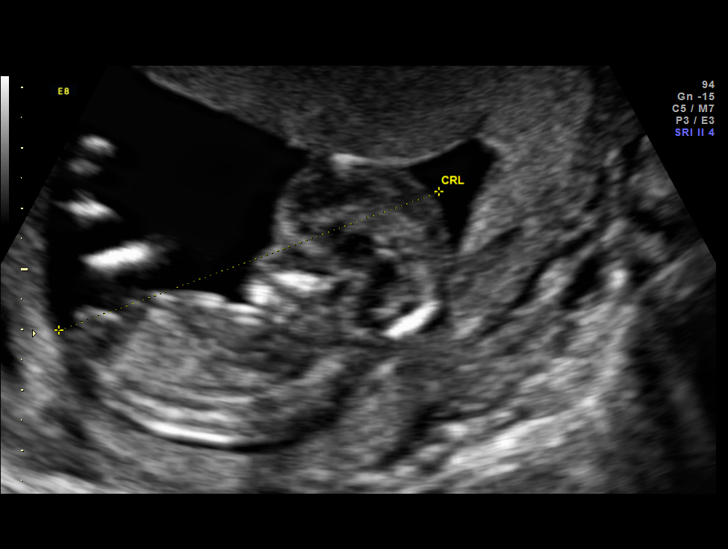
[im 14/27]
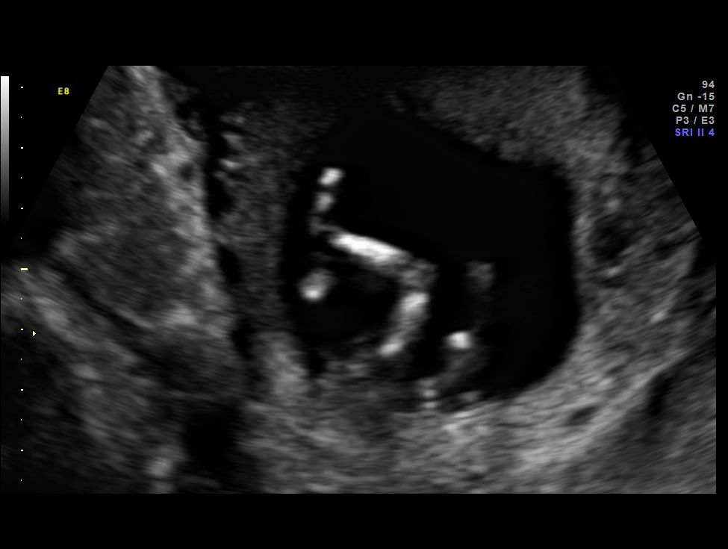
[im 16/27]
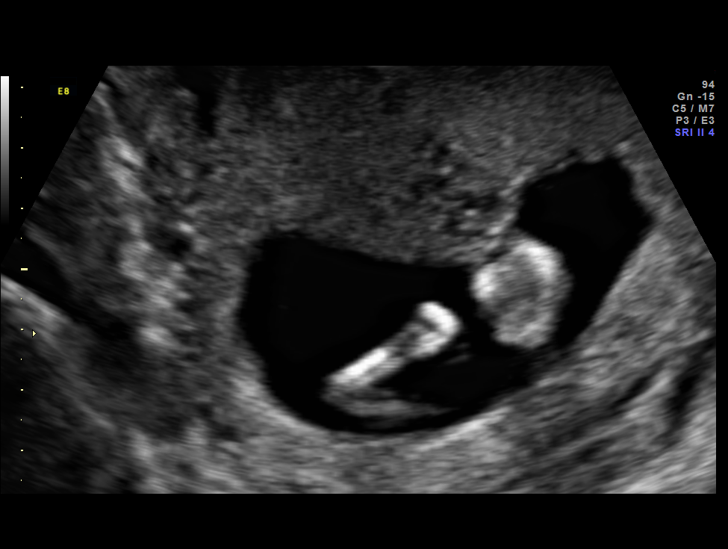
[im 18/27]
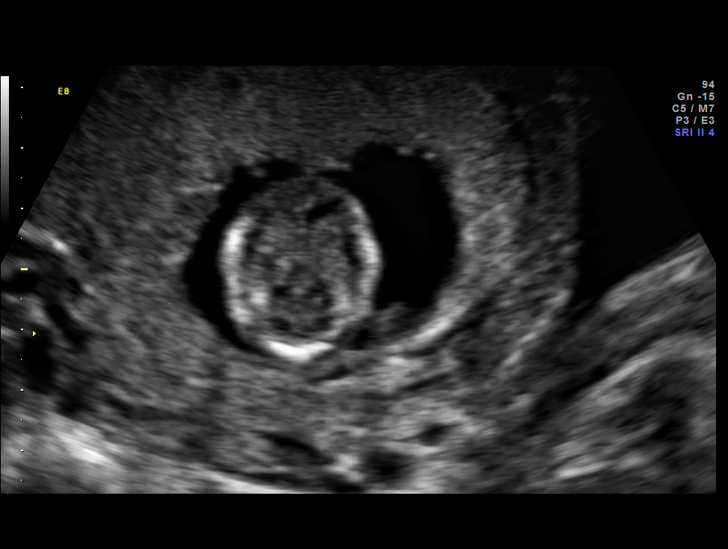
[im 20/27]
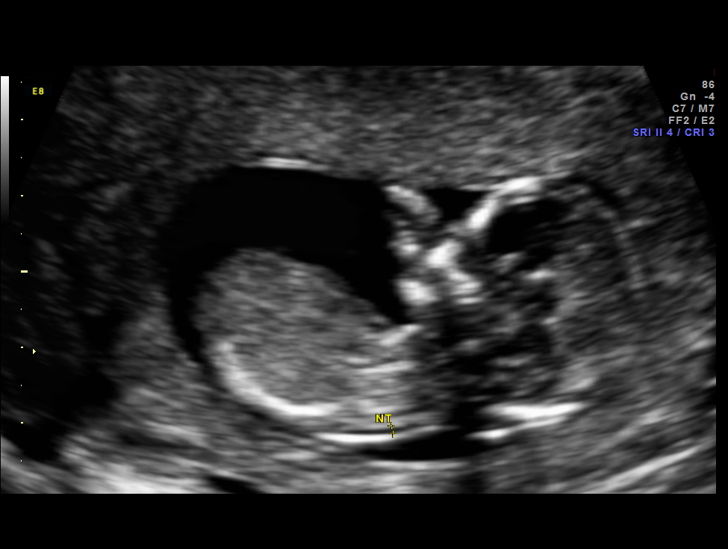
[im 22/27]
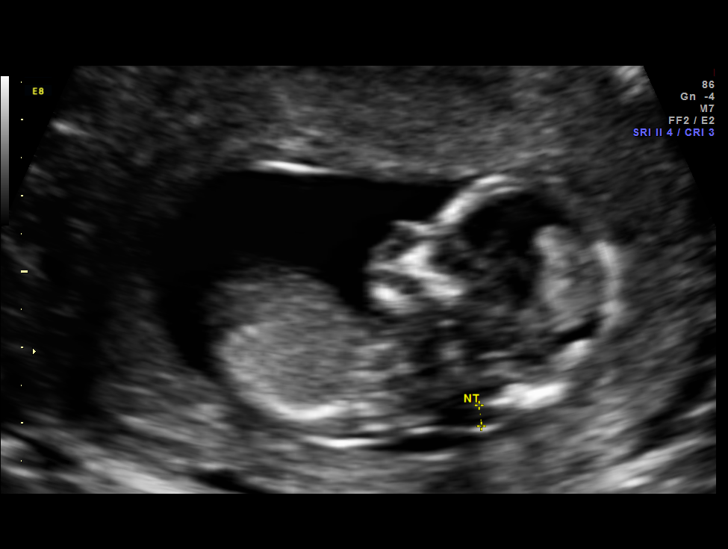
[im 24/27]
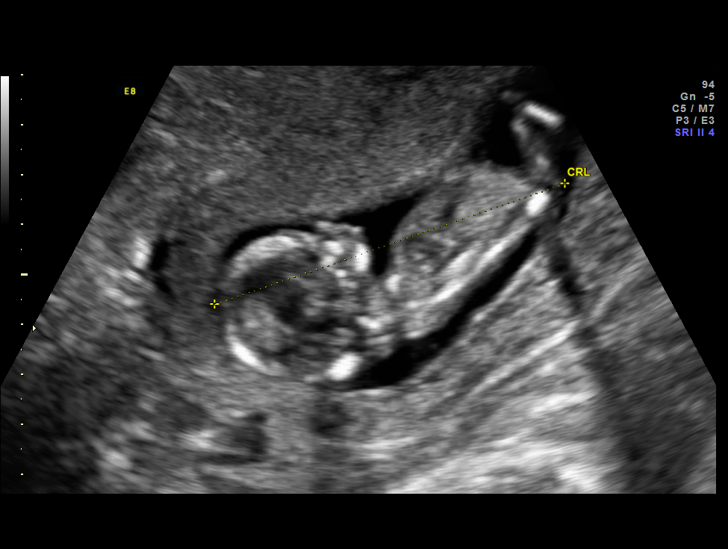
[im 26/27]
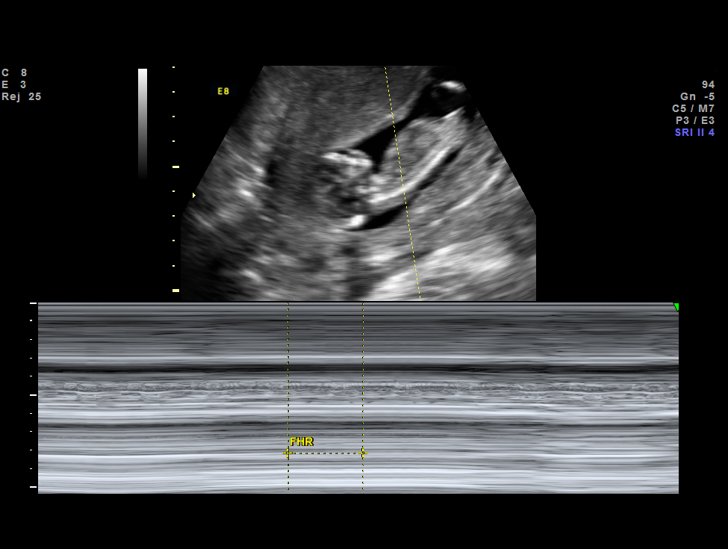

[13 of 27 positions shown; findings below may reference images not displayed]

OBSTETRICS REPORT
                      (Signed Final 09/12/2013 [DATE])

                                                         Faculty Physician
Service(s) Provided

Indications

 First trimester aneuploidy screen (NT)
Fetal Evaluation

 Num Of Fetuses:    1
 Fetal Heart Rate:  134                          bpm
 Cardiac Activity:  Observed
 Presentation:      Cephalic
 Placenta:          Anterior, above cervical os
 P. Cord            Visualized
 Insertion:

 Amniotic Fluid
 AFI FV:      Subjectively within normal limits
Gestational Age

 LMP:           13w 5d        Date:  06/07/13                 EDD:   03/14/14
 Best:          13w 5d     Det. By:  LMP  (06/07/13)          EDD:   03/14/14
1st Trimester Genetic Sonogram Screening

 CRL:            74.2  mm    G. Age:   13w 2d                 EDD:   03/17/14
 Nuc Trans:       2.1  mm
 Nasal Bone:                 Present
Cervix Uterus Adnexa

 Cervix:       Normal appearance by transabdominal scan. Appears
               closed, without funnelling.
 Left Ovary:    Not visualized. No adnexal mass visualized.
 Right Ovary:   Within normal limits.
Impression

 SIUP at 13+5 weeks
 No gross abnormalities identified
 NT measurement was within normal limits for this GA; NB
 present
 Normal amniotic fluid volume
 Measurements consistent with LMP dating
Recommendations

 Offer MSAFP in the second trimester for ONTD screening
 Offer anatomy U/S by 18 weeks

 questions or concerns.

## 2015-12-23 IMAGING — US US OB FOLLOW-UP
1 series · 12 of 28 positions shown · non-contrast
Comparison: none

[Series 1: us ob follow up · 38 acquisitions, 12 frames shown]
[im 2/38]
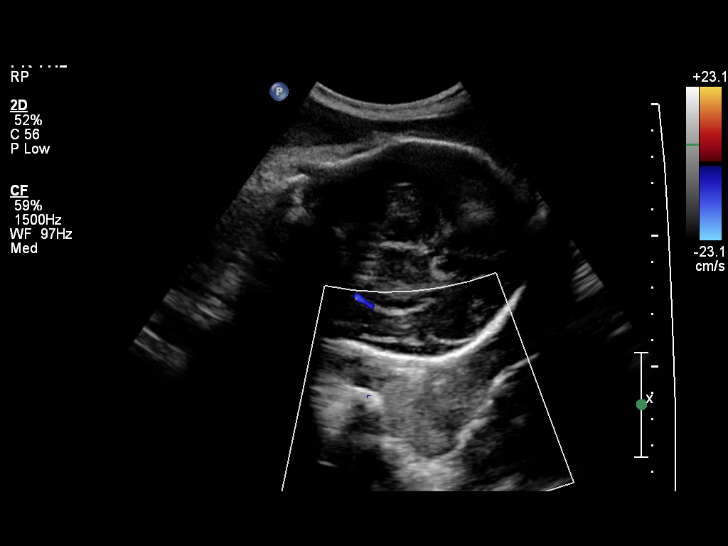
[im 5/38]
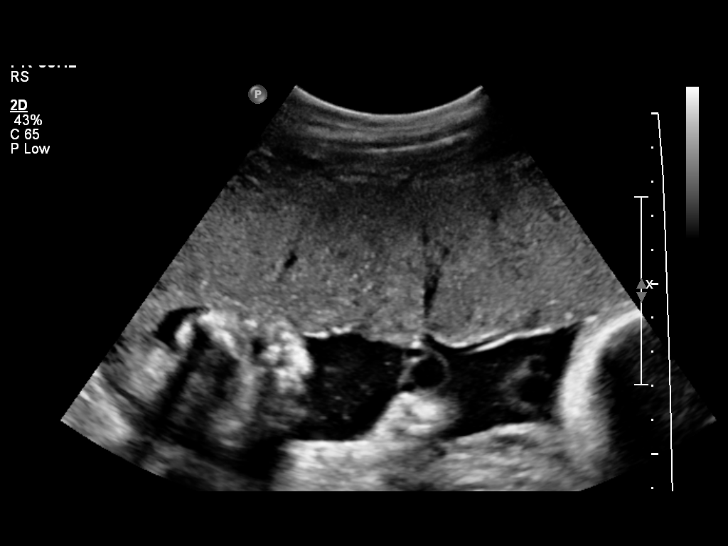
[im 7/38]
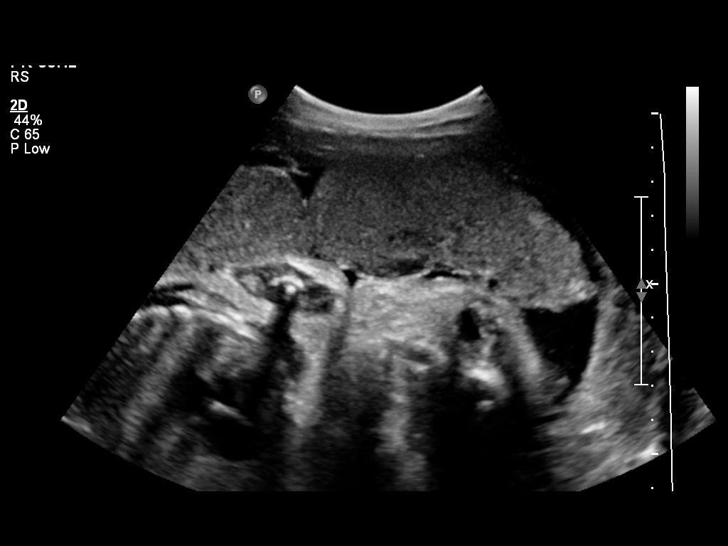
[im 11/38]
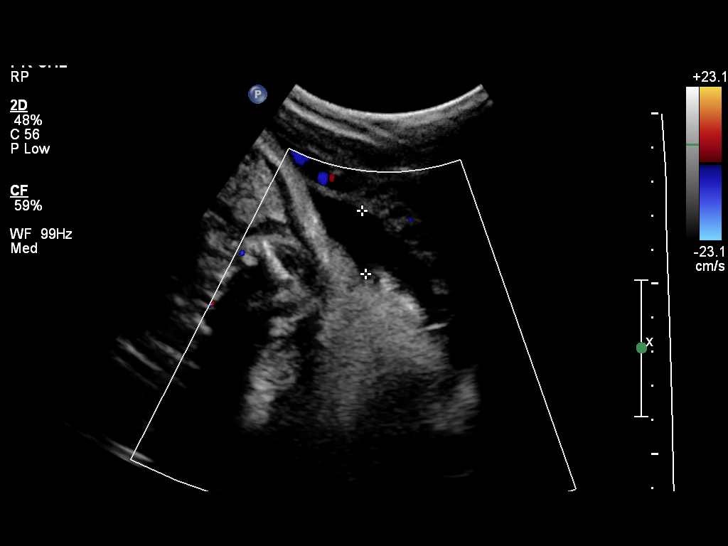
[im 14/38]
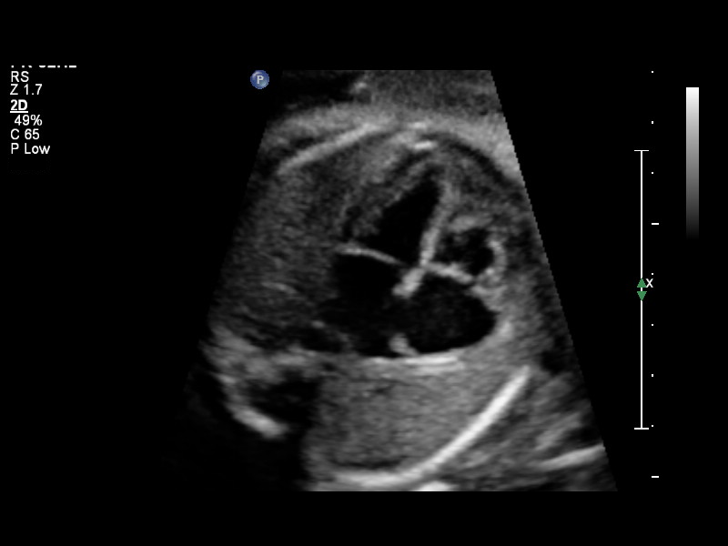
[im 17/38]
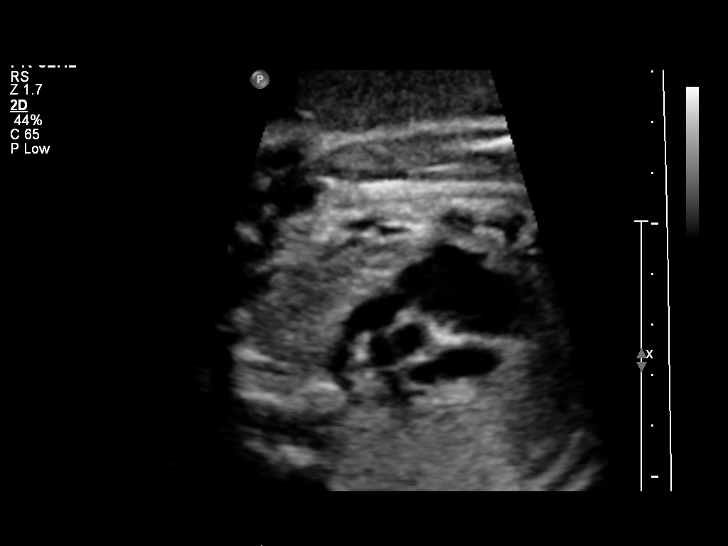
[im 21/38]
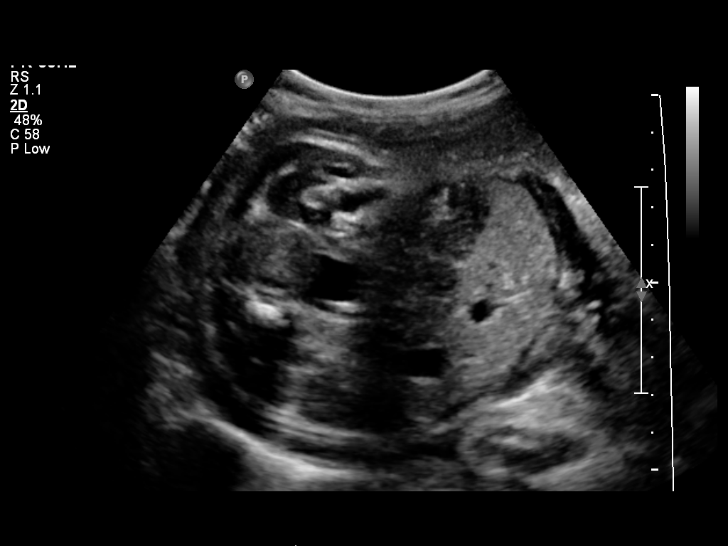
[im 24/38]
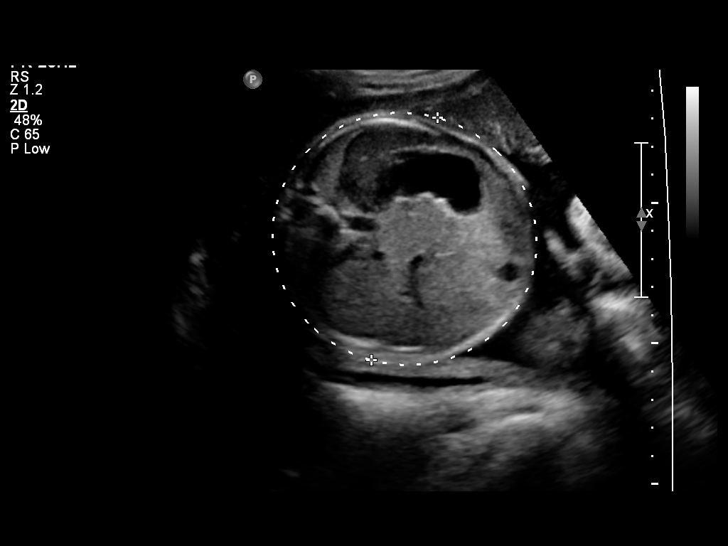
[im 27/38]
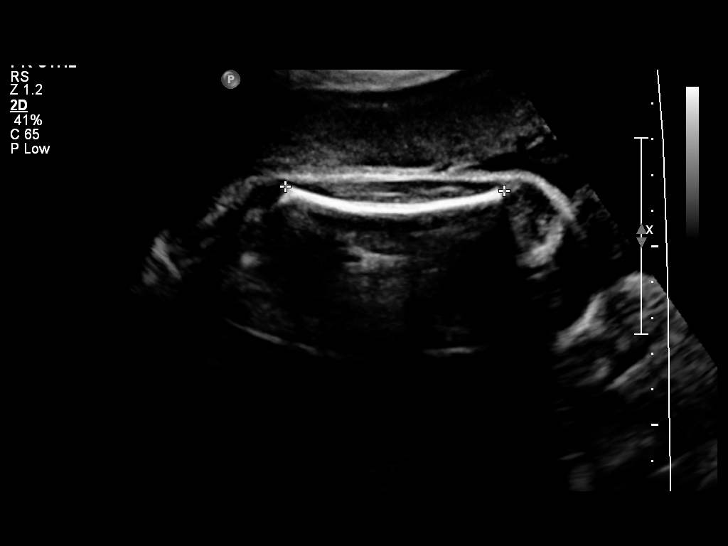
[im 31/38]
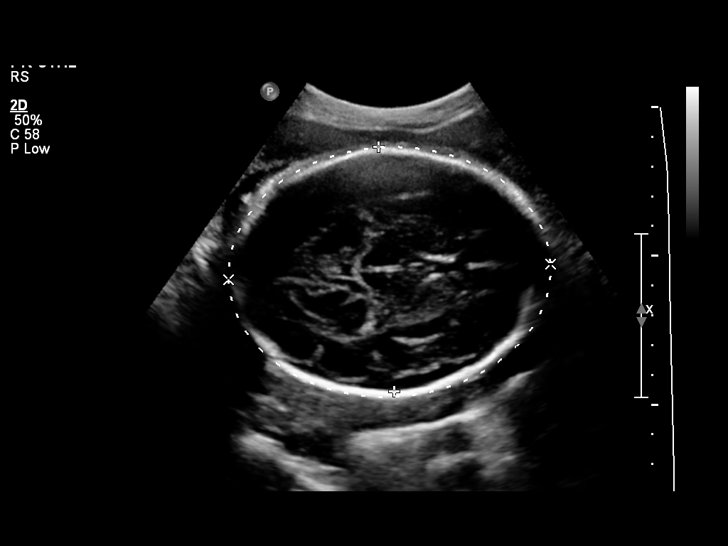
[im 33/38]
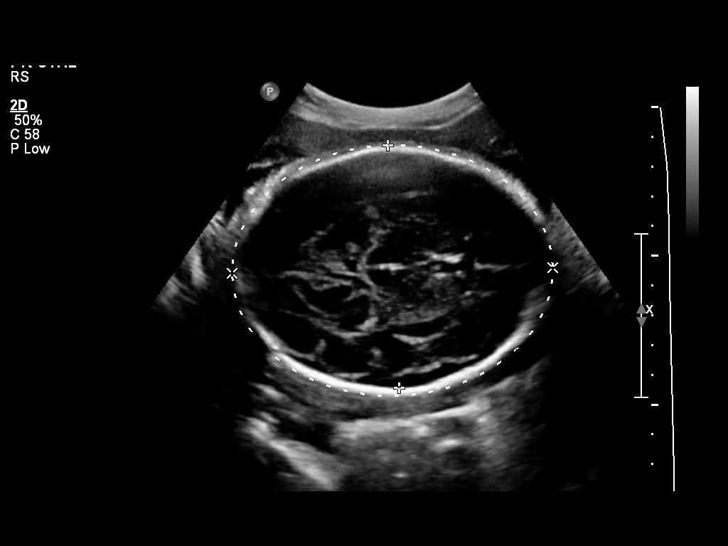
[im 36/38]
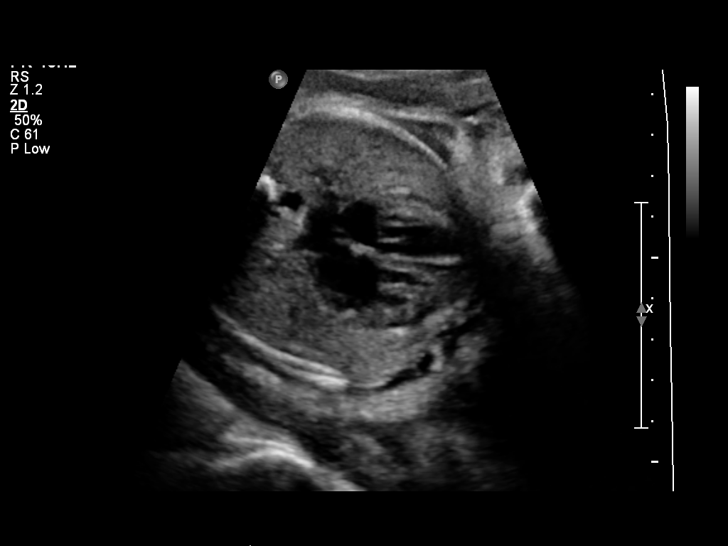

[12 of 28 positions shown; findings below may reference images not displayed]

OBSTETRICS REPORT
                      (Signed Final 01/14/2014 [DATE])

                                                         Faculty Physician
Service(s) Provided

 US OB FOLLOW UP                                       76816.1
Indications

 31 weeks gestation of pregnancy
 Size-Date Discrepancy
Fetal Evaluation

 Num Of Fetuses:    1
 Fetal Heart Rate:  146                          bpm
 Cardiac Activity:  Observed
 Presentation:      Cephalic
 Placenta:          Anterior, above cervical os
 P. Cord            Previously seen as normal
 Insertion:

 Amniotic Fluid
 AFI FV:      Subjectively within normal limits
 AFI Sum:     14.83    cm      52  %Tile      Larg Pckt:   4.78  cm
 RUQ:   4.78    cm   RLQ:    1.85   cm    LUQ:    3.82   cm    LLQ:   4.38    cm
Biometry

 BPD:     81.9   mm    G. Age:  33w 0d                CI:        73.93    70 - 86
                                                      FL/HC:       20.3   19.1 -

 HC:     302.5   mm    G. Age:  33w 4d       69   %   HC/AC:       1.04   0.96 -

 AC:     290.1   mm    G. Age:  33w 0d       85   %   FL/BPD:      75.0   71 - 87
 FL:      61.4   mm    G. Age:  31w 6d       45   %   FL/AC:       21.2   20 - 24
 HUM:     56.6   mm    G. Age:  32w 6d       78   %

 Est. FW:    9554   gm     4 lb 8 oz     76  %
Gestational Age

 LMP:           31w 4d        Date:  06/07/13                 EDD:    03/14/14
 U/S Today:     32w 6d                                        EDD:    03/05/14
 Best:          31w 4d     Det. By:  LMP  (06/07/13)          EDD:    03/14/14
Anatomy
 Cranium:          Appears normal         Aortic Arch:       Previously seen
 Fetal Cavum:      Previously seen        Ductal Arch:       Previously seen
 Ventricles:       Appears normal         Diaphragm:         Appears normal
 Choroid Plexus:   Previously seen        Stomach:           Appears normal, left
                                                             sided
 Cerebellum:       Previously seen        Abdomen:           Appears normal
 Posterior Fossa:  Previously seen        Abdominal Wall:    Previously seen
 Nuchal Fold:      Not applicable (>20    Cord Vessels:      Previously seen
                   wks GA)
 Face:             Orbits and profile     Kidneys:           Appear normal
                   previously seen
 Lips:             Previously seen        Bladder:           Appears normal
 Heart:            Appears normal         Spine:             Previously seen
                   (4CH, axis, and
                   situs)
 RVOT:             Appears normal         Lower              Previously seen
                                          Extremities:
 LVOT:             Appears normal         Upper              Previously seen
                                          Extremities:

 Other:  Female gender previously seen. Heels, Right 5th digit and Nasal
         bone previousy seen.
Targeted Anatomy

 Fetal Central Nervous System
 Lat. Ventricles:
Cervix Uterus Adnexa

 Cervix:       Not visualized (advanced GA >19wks)
 Left Ovary:    Size(cm) L: 2.78 x W: 1.19 x H: 2.08  Volume(cc):
 Right Ovary:   Size(cm) L: 2.75 x W: 1.45 x H: 1.27  Volume(cc):
 Adnexa:     No abnormality visualized.
Impression

 SIUP at 31+4 weeks
 Normal interval anatomy
 Normal amniotic fluid volume
 Appropriate interval growth with EFW at the 76th %tile
Recommendations

 Follow-up as clinically indicated

 questions or concerns.

## 2016-11-21 ENCOUNTER — Encounter: Payer: Self-pay | Admitting: Nurse Practitioner

## 2016-11-21 ENCOUNTER — Ambulatory Visit: Payer: Medicaid Other | Attending: Nurse Practitioner | Admitting: Nurse Practitioner

## 2016-11-21 VITALS — BP 88/56 | HR 94 | Temp 97.7°F | Resp 18 | Ht 66.0 in | Wt 109.0 lb

## 2016-11-21 DIAGNOSIS — I9589 Other hypotension: Secondary | ICD-10-CM

## 2016-11-21 DIAGNOSIS — R42 Dizziness and giddiness: Secondary | ICD-10-CM

## 2016-11-21 DIAGNOSIS — E861 Hypovolemia: Secondary | ICD-10-CM | POA: Insufficient documentation

## 2016-11-21 DIAGNOSIS — Z825 Family history of asthma and other chronic lower respiratory diseases: Secondary | ICD-10-CM | POA: Insufficient documentation

## 2016-11-21 DIAGNOSIS — Z833 Family history of diabetes mellitus: Secondary | ICD-10-CM | POA: Insufficient documentation

## 2016-11-21 DIAGNOSIS — Z79899 Other long term (current) drug therapy: Secondary | ICD-10-CM | POA: Diagnosis not present

## 2016-11-21 DIAGNOSIS — H5310 Unspecified subjective visual disturbances: Secondary | ICD-10-CM | POA: Diagnosis not present

## 2016-11-21 DIAGNOSIS — D649 Anemia, unspecified: Secondary | ICD-10-CM | POA: Insufficient documentation

## 2016-11-21 LAB — POCT URINALYSIS DIPSTICK
Bilirubin, UA: NEGATIVE
Glucose, UA: NEGATIVE
KETONES UA: NEGATIVE
Leukocytes, UA: NEGATIVE
Nitrite, UA: NEGATIVE
PH UA: 5.5 (ref 5.0–8.0)
PROTEIN UA: NEGATIVE
RBC UA: NEGATIVE
SPEC GRAV UA: 1.01 (ref 1.010–1.025)
UROBILINOGEN UA: 0.2 U/dL

## 2016-11-21 MED ORDER — MECLIZINE HCL 32 MG PO TABS: 32.0000 mg | ORAL_TABLET | Freq: Three times a day (TID) | ORAL | 0 refills | Status: DC | PRN

## 2016-11-21 MED ORDER — MECLIZINE HCL 25 MG PO TABS: 25.0000 mg | ORAL_TABLET | Freq: Three times a day (TID) | ORAL | 0 refills | Status: DC | PRN

## 2016-11-21 MED ORDER — SODIUM CHLORIDE 0.9 % IV BOLUS (SEPSIS): 1000.0000 mL | Freq: Once | INTRAVENOUS | Status: DC

## 2016-11-21 MED FILL — MECLIZINE 25 MG TABLET: 25 | 10 days supply | Qty: 30 | Fill #0

## 2016-11-21 NOTE — Patient Instructions (Addendum)
Dehydration, Adult Dehydration is when there is not enough fluid or water in your body. This happens when you lose more fluids than you take in. Dehydration can range from mild to very bad. It should be treated right away to keep it from getting very bad. Symptoms of mild dehydration may include:  Thirst.  Dry lips.  Slightly dry mouth.  Dry, warm skin.  Dizziness. Symptoms of moderate dehydration may include:  Very dry mouth.  Muscle cramps.  Dark pee (urine). Pee may be the color of tea.  Your body making less pee.  Your eyes making fewer tears.  Heartbeat that is uneven or faster than normal (palpitations).  Headache.  Light-headedness, especially when you stand up from sitting.  Fainting (syncope). Symptoms of very bad dehydration may include:  Changes in skin, such as: ? Cold and clammy skin. ? Blotchy (mottled) or pale skin. ? Skin that does not quickly return to normal after being lightly pinched and let go (poor skin turgor).  Changes in body fluids, such as: ? Feeling very thirsty. ? Your eyes making fewer tears. ? Not sweating when body temperature is high, such as in hot weather. ? Your body making very little pee.  Changes in vital signs, such as: ? Weak pulse. ? Pulse that is more than 100 beats a minute when you are sitting still. ? Fast breathing. ? Low blood pressure.  Other changes, such as: ? Sunken eyes. ? Cold hands and feet. ? Confusion. ? Lack of energy (lethargy). ? Trouble waking up from sleep. ? Short-term weight loss. ? Unconsciousness. Follow these instructions at home:  If told by your doctor, drink an ORS: ? Make an ORS by using instructions on the package. ? Start by drinking small amounts, about  cup (120 mL) every 5-10 minutes. ? Slowly drink more until you have had the amount that your doctor said to have.  Drink enough clear fluid to keep your pee clear or pale yellow. If you were told to drink an ORS, finish the ORS  first, then start slowly drinking clear fluids. Drink fluids such as: ? Water. Do not drink only water by itself. Doing that can make the salt (sodium) level in your body get too low (hyponatremia). ? Ice chips. ? Fruit juice that you have added water to (diluted). ? Low-calorie sports drinks.  Avoid: ? Alcohol. ? Drinks that have a lot of sugar. These include high-calorie sports drinks, fruit juice that does not have water added, and soda. ? Caffeine. ? Foods that are greasy or have a lot of fat or sugar.  Take over-the-counter and prescription medicines only as told by your doctor.  Do not take salt tablets. Doing that can make the salt level in your body get too high (hypernatremia).  Eat foods that have minerals (electrolytes). Examples include bananas, oranges, potatoes, tomatoes, and spinach.  Keep all follow-up visits as told by your doctor. This is important. Contact a doctor if:  You have belly (abdominal) pain that: ? Gets worse. ? Stays in one area (localizes).  You have a rash.  You have a stiff neck.  You get angry or annoyed more easily than normal (irritability).  You are more sleepy than normal.  You have a harder time waking up than normal.  You feel: ? Weak. ? Dizzy. ? Very thirsty.  You have peed (urinated) only a small amount of very dark pee during 6-8 hours. Get help right away if:  You have symptoms of   very bad dehydration.  You cannot drink fluids without throwing up (vomiting).  Your symptoms get worse with treatment.  You have a fever.  You have a very bad headache.  You are throwing up or having watery poop (diarrhea) and it: ? Gets worse. ? Does not go away.  You have blood or something green (bile) in your throw-up.  You have blood in your poop (stool). This may cause poop to look black and tarry.  You have not peed in 6-8 hours.  You pass out (faint).  Your heart rate when you are sitting still is more than 100 beats a  minute.  You have trouble breathing. This information is not intended to replace advice given to you by your health care provider. Make sure you discuss any questions you have with your health care provider. Document Released: 10/29/2008 Document Revised: 07/23/2015 Document Reviewed: 02/26/2015 Elsevier Interactive Patient Education  2018 [employer]  Hypotension As your heart beats, it forces blood through your body. This force is called blood pressure. If you have hypotension, you have low blood pressure. When your blood pressure is too low, you may not get enough blood to your brain. You may feel weak, feel light-headed, have a fast heartbeat, or even pass out (faint). Follow these instructions at home: Eating and drinking  Drink enough fluids to keep your pee (urine) clear or pale yellow.  Eat a healthy diet, and follow instructions from your doctor about eating or drinking restrictions. A healthy diet includes: ? Fresh fruits and vegetables. ? Whole grains. ? Low-fat (lean) meats. ? Low-fat dairy products.  Eat extra salt only as told. Do not add extra salt to your diet unless your doctor tells you to.  Eat small meals often.  Avoid standing up quickly after you eat. Medicines  Take over-the-counter and prescription medicines only as told by your doctor. ? Follow instructions from your doctor about changing how much you take (the dosage) of your medicines, if this applies. ? Do not stop or change your medicine on your own. General instructions  Wear compression stockings as told by your doctor.  Get up slowly from lying down or sitting.  Avoid hot showers and a lot of heat as told by your doctor.  Return to your normal activities as told by your doctor. Ask what activities are safe for you.  Do not use any products that contain nicotine or tobacco, such as cigarettes and e-cigarettes. If you need help quitting, ask your doctor.  Keep all follow-up visits as told by  your doctor. This is important. Contact a doctor if:  You throw up (vomit).  You have watery poop (diarrhea).  You have a fever for more than 2-3 days.  You feel more thirsty than normal.  You feel weak and tired. Get help right away if:  You have chest pain.  You have a fast or irregular heartbeat.  You lose feeling (get numbness) in any part of your body.  You cannot move your arms or your legs.  You have trouble talking.  You get sweaty or feel light-headed.  You faint.  You have trouble breathing.  You have trouble staying awake.  You feel confused. This information is not intended to replace advice given to you by your health care provider. Make sure you discuss any questions you have with your health care provider. Document Released: 03/29/2009 Document Revised: 09/21/2015 Document Reviewed: 09/21/2015 Elsevier Interactive Patient Education  2017 Else[employer] Inc.  Vertigo Vertigo means  that you feel like you are moving when you are not. Vertigo can also make you feel like things around you are moving when they are not. This feeling can come and go at any time. Vertigo often goes away on its own. Follow these instructions at home:  Avoid making fast movements.  Avoid driving.  Avoid using heavy machinery.  Avoid doing any task or activity that might cause danger to you or other people if you would have a vertigo attack while you are doing it.  Sit down right away if you feel dizzy or have trouble with your balance.  Take over-the-counter and prescription medicines only as told by your doctor.  Follow instructions from your doctor about which positions or movements you should avoid.  Drink enough fluid to keep your pee (urine) clear or pale yellow.  Keep all follow-up visits as told by your doctor. This is important. Contact a doctor if:  Medicine does not help your vertigo.  You have a fever.  Your problems get worse or you have new symptoms.  Your  family or friends see changes in your behavior.  You feel sick to your stomach (nauseous) or you throw up (vomit).  You have a "pins and needles" feeling or you are numb in part of your body. Get help right away if:  You have trouble moving or talking.  You are always dizzy.  You pass out (faint).  You get very bad headaches.  You feel weak or have trouble using your hands, arms, or legs.  You have changes in your hearing.  You have changes in your seeing (vision).  You get a stiff neck.  Bright light starts to bother you. This information is not intended to replace advice given to you by your health care provider. Make sure you discuss any questions you have with your health care provider. Document Released: 10/12/2007 Document Revised: 06/10/2015 Document Reviewed: 04/27/2014 Elsevier Interactive Patient Education  [employer]2018 [employer]

## 2016-11-21 NOTE — Progress Notes (Signed)
Subjective:   Chief Complaint  Patient presents with  . New Patient (Initial Visit)    Patient stated that her lower abdominal hurts and she also get dizzy when she lays down and walk.   HPI [name] 26 y.o. female presents with dizziness.   Vertigo - Dizziness Patient presents with dizziness.  The dizziness has been present for 4 weeks. The patient describes the symptoms as disequalibirum. Symptoms are exacerbated by lying down in bed.  The patient also complains of none. Patient denies aural pressure, otalgia, otorrhea, tinnitus or hearing loss. She does endorse visual disturbances with driving at night. She has never had an eye exam. She does not drink enough fluids (about 24oz of water per day). Reports decreased appetite as well. Drinks about 24oz of water per day. She has a history of anemia. Most recent menstrual cycle was 10-21 and she report menorrhagia for 8 days. She has a history of abnormal PAP smear.   Hypotension She is hypotensive today. Likely due to po intake.  BP Readings from Last 3 Encounters:  11/21/16 (!) 88/56  08/13/15 102/72  02/22/14 (!) 100/56       Past Medical History:  Diagnosis Date  . Abnormal Pap smear    ASCUS  . Anemia   . Choroid plexus cysts, fetal, affecting care of mother, antepartum     Past Surgical History:  Procedure Laterality Date  . NO PAST SURGERIES      Family History  Problem Relation Age of Onset  . Asthma Brother   . Diabetes Paternal Grandfather     Social History   Socioeconomic History  . Marital status: Married    Spouse name: Not on file  . Number of children: Not on file  . Years of education: Not on file  . Highest education level: Not on file  Social Needs  . Financial resource strain: Not on file  . Food insecurity - worry: Not on file  . Food insecurity - inability: Not on file  . Transportation needs - medical: Not on file  . Transportation needs - non-medical: Not on file  Occupational  History  . Not on file  Tobacco Use  . Smoking status: Never Smoker  . Smokeless tobacco: Never Used  Substance and Sexual Activity  . Alcohol use: No  . Drug use: No  . Sexual activity: Yes    Birth control/protection: Pill  Other Topics Concern  . Not on file  Social History Narrative  . Not on file    Outpatient Medications Prior to Visit  Medication Sig Dispense Refill  . acetaminophen (TYLENOL) 325 MG tablet Take 2 tablets (650 mg total) by mouth every 4 (four) hours as needed (mild discomfortORtemperature>/= 100.4 F). 30 tablet 0  . Prenatal Vit-Fe Fumarate-FA (PRENATAL MULTIVITAMIN) TABS tablet Take 1 tablet by mouth daily at 12 noon.     No facility-administered medications prior to visit.     No Known Allergies  Review of Systems  Constitutional: Negative for fever, malaise/fatigue and weight loss.  HENT: Negative.  Negative for nosebleeds.   Eyes: Positive for blurred vision. Negative for double vision, photophobia, pain and discharge.  Respiratory: Negative.  Negative for cough and shortness of breath.   Cardiovascular: Negative.  Negative for chest pain, palpitations and leg swelling.  Gastrointestinal: Positive for abdominal pain. Negative for blood in stool, constipation, diarrhea, heartburn, melena, nausea and vomiting.  Genitourinary: Negative.  Negative for dysuria, flank pain, frequency, hematuria and urgency.  Musculoskeletal:  Positive for back pain. Negative for falls, joint pain, myalgias and neck pain.  Neurological: Positive for dizziness and headaches. Negative for tingling, tremors, sensory change, speech change, focal weakness and seizures.  Endo/Heme/Allergies: Negative for environmental allergies.  Psychiatric/Behavioral: Negative.  Negative for suicidal ideas.       Objective:    Physical Exam  Constitutional: She is oriented to person, place, and time. She appears well-developed and well-nourished. She is cooperative.  HENT:  Head:  Normocephalic and atraumatic.  Eyes: Conjunctivae and EOM are normal. Pupils are equal, round, and reactive to light. Right eye exhibits no discharge. Left eye exhibits no discharge. No scleral icterus.  Neck: Normal range of motion.  Cardiovascular: Normal rate, regular rhythm, normal heart sounds and intact distal pulses. Exam reveals no gallop and no friction rub.  No murmur heard. Pulmonary/Chest: Effort normal and breath sounds normal. No tachypnea. No respiratory distress. She has no decreased breath sounds. She has no wheezes. She has no rhonchi. She has no rales. She exhibits no tenderness.  Abdominal: Soft. Bowel sounds are normal. She exhibits no distension and no mass. There is no hepatosplenomegaly. There is tenderness in the periumbilical area and suprapubic area. There is no rigidity, no rebound, no guarding, no CVA tenderness, no tenderness at McBurney's point and negative Murphy's sign. No hernia.  Musculoskeletal: Normal range of motion. She exhibits no edema or tenderness.       Lumbar back: She exhibits pain.  Neurological: She is alert and oriented to person, place, and time. Coordination normal.  Skin: Skin is warm and dry.  Psychiatric: She has a normal mood and affect. Her behavior is normal. Judgment and thought content normal.  Nursing note and vitals reviewed.   BP (!) 88/56 (BP Location: Right Arm, Patient Position: Sitting, Cuff Size: Normal)   Pulse 94   Temp 97.7 F (36.5 C) (Oral)   Resp 18   Ht 5\' 6"  (1.676 m)   Wt 109 lb (49.4 kg)   LMP 11/05/2016   SpO2 98%   BMI 17.59 kg/m  Wt Readings from Last 3 Encounters:  11/21/16 109 lb (49.4 kg)  08/11/15 128 lb (58.1 kg)  02/21/14 127 lb (57.6 kg)    Immunization History  Administered Date(s) Administered  . Tdap 02/10/2011       Assessment & Plan:   Problem List Items Addressed This Visit    None    Visit Diagnoses    Dizziness    -  Primary   Relevant Medications   sodium chloride 0.9 % bolus  1,000 mL   meclizine (ANTIVERT) 25 MG tablet   Other Relevant Orders   CBC   TSH   Basic Metabolic Panel   Urinalysis Dipstick (Completed)   Subjective visual disturbance       Relevant Orders   Ambulatory referral to Ophthalmology   Hypotension due to hypovolemia       Relevant Medications   sodium chloride 0.9 % bolus 1,000 mL       I am also having her start on meclizine. Additionally, I am having her maintain her prenatal multivitamin and acetaminophen. We administered 1L of  sodium chloride via IV today.  Meds ordered this encounter  Medications  . sodium chloride 0.9 % bolus 1,000 mL  . DISCONTD: meclizine (ANTIVERT) 32 MG tablet    Sig: Take 1 tablet (32 mg total) 3 (three) times daily as needed by mouth.    Dispense:  30 tablet    Refill:  0    Order Specific Question:   Supervising Provider    Answer:   Quentin AngstJEGEDE, OLUGBEMIGA E [id number]]  . meclizine (ANTIVERT) 25 MG tablet    Sig: Take 1 tablet (25 mg total) 3 (three) times daily as needed by mouth for dizziness.    Dispense:  30 tablet    Refill:  0     Claiborne RiggZelda W Mylon Mabey, NP

## 2016-11-22 ENCOUNTER — Telehealth: Payer: Self-pay

## 2016-11-22 LAB — CBC
HEMATOCRIT: 39.7 % (ref 34.0–46.6)
Hemoglobin: 13 g/dL (ref 11.1–15.9)
MCH: 29.9 pg (ref 26.6–33.0)
MCHC: 32.7 g/dL (ref 31.5–35.7)
MCV: 91 fL (ref 79–97)
PLATELETS: 227 10*3/uL (ref 150–379)
RBC: 4.35 x10E6/uL (ref 3.77–5.28)
RDW: 13.7 % (ref 12.3–15.4)
WBC: 5.3 10*3/uL (ref 3.4–10.8)

## 2016-11-22 LAB — BASIC METABOLIC PANEL
BUN/Creatinine Ratio: 13 (ref 9–23)
BUN: 10 mg/dL (ref 6–20)
CALCIUM: 9.6 mg/dL (ref 8.7–10.2)
CO2: 25 mmol/L (ref 20–29)
CREATININE: 0.78 mg/dL (ref 0.57–1.00)
Chloride: 102 mmol/L (ref 96–106)
GFR calc Af Amer: 120 mL/min/{1.73_m2} (ref >59)
GFR calc non Af Amer: 104 mL/min/{1.73_m2} (ref >59)
GLUCOSE: 83 mg/dL (ref 65–99)
POTASSIUM: 4.1 mmol/L (ref 3.5–5.2)
SODIUM: 140 mmol/L (ref 134–144)

## 2016-11-22 LAB — TSH: TSH: 3.53 u[IU]/mL (ref 0.450–4.500)

## 2016-11-22 NOTE — Telephone Encounter (Signed)
Pt was called and a VM was left informing pt to return phone call for lab results.  If pt returns phone call please inform pt that Labs look great. There is no indication of anemia or diabetes and thyroid study is normal. Likely cause of your dizziness was dehydration. Continue to drink at least 6-8 glasses of water per day.

## 2016-11-22 NOTE — Telephone Encounter (Signed)
Pt called back and reviewed results.

## 2016-12-19 ENCOUNTER — Ambulatory Visit: Payer: Medicaid Other | Attending: Nurse Practitioner | Admitting: *Deleted

## 2016-12-19 VITALS — BP 100/60 | HR 83

## 2016-12-19 DIAGNOSIS — E861 Hypovolemia: Secondary | ICD-10-CM

## 2016-12-19 DIAGNOSIS — Z0130 Encounter for examination of blood pressure without abnormal findings: Secondary | ICD-10-CM

## 2016-12-19 DIAGNOSIS — I9589 Other hypotension: Secondary | ICD-10-CM

## 2016-12-19 NOTE — Patient Instructions (Signed)
Call  [facility] - [phone] EXT. [address]  [phone] M-F 8 to 5p  [email or web address]

## 2016-12-19 NOTE — Progress Notes (Signed)
[facility].  Last OV  11/6/ 2018 pt was hypotensive with PCP. She is here to follow up with BP. Pt denies chest pain, SOB, dizziness, or blurred vision.  Manual blood pressure reading: 100/60

## 2017-01-02 ENCOUNTER — Ambulatory Visit: Payer: Medicaid Other | Attending: Nurse Practitioner | Admitting: *Deleted

## 2017-01-02 DIAGNOSIS — N926 Irregular menstruation, unspecified: Secondary | ICD-10-CM | POA: Insufficient documentation

## 2017-01-02 LAB — POCT URINE PREGNANCY: Preg Test, Ur: NEGATIVE

## 2017-01-02 NOTE — Progress Notes (Signed)
Pt has 3 positive home pregnacy test and request pregnancy test from office today.  POCT urine HCG was negative. Will do Serum HCG.

## 2017-01-03 LAB — HCG, SERUM, QUALITATIVE: HCG, BETA SUBUNIT, QUAL, SERUM: NEGATIVE m[IU]/mL (ref <6)

## 2017-01-06 NOTE — Progress Notes (Signed)
Noted. Agreed.

## 2017-01-16 NOTE — L&D Delivery Note (Signed)
Patient is a 27 y.o. now G6P6 s/p NSVD at [redacted]w[redacted]d, who was admitted for SOL.  She progressed without augmentation to complete and pushed less than 1 minutes to deliver encaul.  Cord clamping delayed by several minutes then clamped by CNM and cut by FOB.  Placenta intact and spontaneous, bleeding minimal.  Mom and baby stable prior to transfer to postpartum. She plans on breastfeeding. She requests POPs and condoms for birth control.  Delivery Note At 9:54 PM a viable and healthy female was delivered via Vaginal, Spontaneous (Presentation: LOA ).  APGAR: 9, 10; weight pending  .   Placenta intact and spontaneous, bleeding minimal. 3V Cord:  with the following complications: precip delivery.  Anesthesia: None Episiotomy: None Lacerations: None Suture Repair: None Est. Blood Loss (mL): 179  Mom to postpartum.  Baby to Couplet care / Skin to Skin.  [name] CNM 11/04/2017, 10:08 PM

## 2017-03-27 ENCOUNTER — Other Ambulatory Visit: Payer: Self-pay

## 2017-03-27 ENCOUNTER — Emergency Department (HOSPITAL_COMMUNITY)
Admission: EM | Admit: 2017-03-27 | Discharge: 2017-03-27 | Disposition: A | Discharge status: Home / Self Care | Payer: Medicaid Other | Attending: Emergency Medicine | Admitting: Emergency Medicine

## 2017-03-27 ENCOUNTER — Encounter (HOSPITAL_COMMUNITY): Payer: Self-pay

## 2017-03-27 DIAGNOSIS — J029 Acute pharyngitis, unspecified: Secondary | ICD-10-CM | POA: Diagnosis not present

## 2017-03-27 LAB — RAPID STREP SCREEN (MED CTR MEBANE ONLY): Streptococcus, Group A Screen (Direct): NEGATIVE

## 2017-03-27 MED ORDER — ACETAMINOPHEN 325 MG PO TABS: 650.0000 mg | ORAL_TABLET | Freq: Four times a day (QID) | ORAL | 0 refills | Status: DC | PRN

## 2017-03-27 MED ORDER — ACETAMINOPHEN 325 MG PO TABS
650.0000 mg | ORAL_TABLET | Freq: Once | ORAL | Status: AC
  Administered 2017-03-27: 650 mg via ORAL
  Filled 2017-03-27: qty 2

## 2017-03-27 MED ORDER — ACETAMINOPHEN 325 MG PO TABS: 650.0000 mg | ORAL_TABLET | ORAL | 0 refills | Status: DC | PRN

## 2017-03-27 NOTE — Discharge Instructions (Signed)
You were seen in the emergency department today for a sore throat.  Your strep test was negative, we will send this off for culture and call you if the results are positive.  At this time you do not require an antibiotic, as we have not found a bacterial infection.  At this time we suspect that your sore throat is related to a viral infection.  We have given you a prescription for Tylenol, you may take 2 tablets every 6 hours as needed for pain. We also recommend gargling with salt water as well.   Please see the attached hand out of pharyngitis for further information.   Follow up with your primary care doctor in the next 3 days for re-evaluation and further management. Return to the emergency department for new or worsening sxs including, but not limited to inability to open your mother or move your neck, difficulty breathing, or any other concerns you may have.

## 2017-03-27 NOTE — ED Provider Notes (Signed)
[facility] Provider Note   CSN: [id number] Arrival date & time: 03/27/17  1013     History   Chief Complaint Chief Complaint  Patient presents with  . Sore Throat    [name] is a 27 y.o. female who presents to the ED with complaint of sore throat that has been progressively worsening over the past 2 days. Patient states pain is located in the throat, radiates to bilateral ears. Rates pain a 10/10 in severity, worse with swallowing, however she is able to swallow. No alleviating factors. Has not tried at home intervention due to being unsure what she is able to take given she is currently 7 weeks and 5 days pregnant. Patient is G5 P4 A0. Denies fever, chills, or congestion.  HPI  Past Medical History:  Diagnosis Date  . Abnormal Pap smear    ASCUS  . Anemia   . Choroid plexus cysts, fetal, affecting care of mother, antepartum     Patient Active Problem List   Diagnosis Date Noted  . Active labor at term 08/11/2015  . Seasonal allergies 12/13/2011  . Annual physical exam 12/13/2011  . Underweight 12/13/2011    Past Surgical History:  Procedure Laterality Date  . NO PAST SURGERIES      OB History    Gravida Para Term Preterm AB Living   6 5 5     5    SAB TAB Ectopic Multiple Live Births         0 1       Home Medications    Prior to Admission medications   Medication Sig Start Date End Date Taking? Authorizing Provider  acetaminophen (TYLENOL) 325 MG tablet Take 2 tablets (650 mg total) by mouth every 4 (four) hours as needed (mild discomfortORtemperature>/= 100.4 F). 08/13/15   Lorne SkeensSchenk, Nicholas Michael, MD  meclizine (ANTIVERT) 25 MG tablet Take 1 tablet (25 mg total) 3 (three) times daily as needed by mouth for dizziness. 11/21/16   Claiborne RiggFleming, Zelda W, NP  Prenatal Vit-Fe Fumarate-FA (PRENATAL MULTIVITAMIN) TABS tablet Take 1 tablet by mouth daily at 12 noon.    [provider]    Family  History Family History  Problem Relation Age of Onset  . Asthma Brother   . Diabetes Paternal Grandfather     Social History Social History   Tobacco Use  . Smoking status: Never Smoker  . Smokeless tobacco: Never Used  Substance Use Topics  . Alcohol use: No  . Drug use: No     Allergies   Patient has no known allergies.   Review of Systems Review of Systems  Constitutional: Negative for chills and fever.  HENT: Positive for ear pain, sore throat and trouble swallowing (painful, but able). Negative for congestion.   Respiratory: Negative for shortness of breath.     Physical Exam Updated Vital Signs BP 102/74 (BP Location: Right Arm)   Pulse (!) 103   Temp 98.6 F (37 C) (Oral)   Ht 5\' 6"  (1.676 m)   Wt 48.1 kg (106 lb)   LMP 12/01/2016 (Exact Date)   SpO2 100%   BMI 17.11 kg/m   Physical Exam  Constitutional: She appears well-developed and well-nourished.  Non-toxic appearance. No distress.  HENT:  Head: Normocephalic and atraumatic.  Right Ear: Tympanic membrane is not perforated, not erythematous, not retracted and not bulging.  Left Ear: Tympanic membrane is not perforated, not erythematous, not retracted and not bulging.  Nose: Nose normal.  Mouth/Throat: Uvula is midline. Posterior oropharyngeal erythema present. No oropharyngeal exudate, posterior oropharyngeal edema or tonsillar abscesses.  Patient is tolerating her own secretions without difficulty.  No trismus or drooling.  No hot potato voice.  Submandibular compartment is soft.  Eyes: Conjunctivae are normal. Pupils are equal, round, and reactive to light. Right eye exhibits no discharge. Left eye exhibits no discharge.  Neck: Normal range of motion. Neck supple.  Cardiovascular: Normal rate and regular rhythm.  No murmur heard. Pulmonary/Chest: Breath sounds normal. No respiratory distress. She has no wheezes. She has no rales.  Lymphadenopathy:    She has no cervical adenopathy.  Neurological:  She is alert.  Skin: Skin is warm and dry. No rash noted.  Psychiatric: She has a normal mood and affect. Her behavior is normal.  Nursing note and vitals reviewed.   ED Treatments / Results  Labs Results for orders placed or performed during the hospital encounter of 03/27/17  Rapid strep screen  Result Value Ref Range   Streptococcus, Group A Screen (Direct) NEGATIVE NEGATIVE   No results found. EKG  EKG Interpretation None       Radiology No results found.  Procedures Procedures (including critical care time)  Medications Ordered in ED Medications - No data to display   Initial Impression / Assessment and Plan / ED Course  I have reviewed the triage vital signs and the nursing notes.  Pertinent labs & imaging results that were available during my care of the patient were reviewed by me and considered in my medical decision making (see chart for details).  Patient presents with sore throat. Patient is nontoxic appearing in no apparent distress, vitals WNL other then initial tachycardia which normalized throughout visit. Rapid strep ordered and negative. No abx therapy at this time, culture is pending. Presentation non concerning for PTA or RPA. No trismus, uvula deviation, or hot potato voice. Patient has full painless ROM of the neck. Given patient is pregnant recommended Tylenol and salt water rinses. Patient does not appear dehydrated, but did discuss importance of water rehydration. I discussed results, treatment plan, need for PCP follow-up, and return precautions with the patient. Provided opportunity for questions, patient confirmed understanding and is in agreement with plan.      Final Clinical Impressions(s) / ED Diagnoses   Final diagnoses:  Pharyngitis, unspecified etiology    ED Discharge Orders        Ordered    acetaminophen (TYLENOL) 325 MG tablet  Every 4 hours PRN,   Status:  Discontinued     03/27/17 1115    acetaminophen (TYLENOL) 325 MG tablet   Every 6 hours PRN     03/27/17 [address], [place], PA-C 03/27/17 1153    Jacalyn Lefevre, MD 03/27/17 1455

## 2017-03-27 NOTE — ED Triage Notes (Signed)
Pt c/o sore throat and pain with swallowing X2 days. Pt states she is unsure what to take because she is [redacted] weeks pregnant.

## 2017-03-28 ENCOUNTER — Emergency Department (HOSPITAL_COMMUNITY)
Admission: EM | Admit: 2017-03-28 | Discharge: 2017-03-28 | Disposition: A | Discharge status: Home / Self Care | Payer: Medicaid Other | Attending: Emergency Medicine | Admitting: Emergency Medicine

## 2017-03-28 ENCOUNTER — Encounter (HOSPITAL_COMMUNITY): Payer: Self-pay | Admitting: *Deleted

## 2017-03-28 ENCOUNTER — Other Ambulatory Visit: Payer: Self-pay

## 2017-03-28 DIAGNOSIS — J039 Acute tonsillitis, unspecified: Secondary | ICD-10-CM | POA: Diagnosis not present

## 2017-03-28 DIAGNOSIS — J029 Acute pharyngitis, unspecified: Secondary | ICD-10-CM | POA: Diagnosis present

## 2017-03-28 LAB — CBC WITH DIFFERENTIAL/PLATELET
BASOS PCT: 0 %
Basophils Absolute: 0 10*3/uL (ref 0.0–0.1)
EOS PCT: 1 %
Eosinophils Absolute: 0.1 10*3/uL (ref 0.0–0.7)
HCT: 35.7 % — ABNORMAL LOW (ref 36.0–46.0)
HEMOGLOBIN: 12.2 g/dL (ref 12.0–15.0)
LYMPHS ABS: 1 10*3/uL (ref 0.7–4.0)
Lymphocytes Relative: 11 %
MCH: 30.2 pg (ref 26.0–34.0)
MCHC: 34.2 g/dL (ref 30.0–36.0)
MCV: 88.4 fL (ref 78.0–100.0)
MONOS PCT: 4 %
Monocytes Absolute: 0.4 10*3/uL (ref 0.1–1.0)
NEUTROS PCT: 84 %
Neutro Abs: 7.2 10*3/uL (ref 1.7–7.7)
PLATELETS: 188 10*3/uL (ref 150–400)
RBC: 4.04 MIL/uL (ref 3.87–5.11)
RDW: 12.8 % (ref 11.5–15.5)
WBC: 8.6 10*3/uL (ref 4.0–10.5)

## 2017-03-28 LAB — COMPREHENSIVE METABOLIC PANEL
ALT: 12 U/L — AB (ref 14–54)
ANION GAP: 11 (ref 5–15)
AST: 17 U/L (ref 15–41)
Albumin: 3.8 g/dL (ref 3.5–5.0)
Alkaline Phosphatase: 42 U/L (ref 38–126)
BILIRUBIN TOTAL: 0.7 mg/dL (ref 0.3–1.2)
BUN: 6 mg/dL (ref 6–20)
CO2: 22 mmol/L (ref 22–32)
CREATININE: 0.67 mg/dL (ref 0.44–1.00)
Calcium: 9.2 mg/dL (ref 8.9–10.3)
Chloride: 104 mmol/L (ref 101–111)
GFR calc non Af Amer: >60 mL/min (ref >60)
GLUCOSE: 104 mg/dL — AB (ref 65–99)
Potassium: 4 mmol/L (ref 3.5–5.1)
Sodium: 137 mmol/L (ref 135–145)
TOTAL PROTEIN: 7.2 g/dL (ref 6.5–8.1)

## 2017-03-28 LAB — I-STAT CG4 LACTIC ACID, ED
Lactic Acid, Venous: 1.37 mmol/L (ref 0.5–1.9)
Lactic Acid, Venous: 0.89 mmol/L (ref 0.5–1.9)

## 2017-03-28 LAB — MONONUCLEOSIS SCREEN: MONO SCREEN: NEGATIVE

## 2017-03-28 MED ORDER — ONDANSETRON 4 MG PO TBDP
4.0000 mg | ORAL_TABLET | Freq: Once | ORAL | Status: AC
  Administered 2017-03-28: 4 mg via ORAL
  Filled 2017-03-28: qty 1

## 2017-03-28 MED ORDER — HYDROCODONE-ACETAMINOPHEN 5-325 MG PO TABS
2.0000 | ORAL_TABLET | Freq: Once | ORAL | Status: AC
  Administered 2017-03-28: 2 via ORAL
  Filled 2017-03-28: qty 2

## 2017-03-28 MED ORDER — PENICILLIN V POTASSIUM 500 MG PO TABS: 500.0000 mg | ORAL_TABLET | Freq: Four times a day (QID) | ORAL | 0 refills | Status: AC

## 2017-03-28 NOTE — ED Provider Notes (Signed)
[facility] EMERGENCY DEPARTMENT Provider Note   CSN: [id number] Arrival date & time: 03/28/17  1129     History   Chief Complaint Chief Complaint  Patient presents with  . Sore Throat    [name] is a 27 y.o. female.  The history is provided by the patient. No language interpreter was used.  Sore Throat  This is a new problem. The current episode started 2 days ago. The problem occurs constantly. The problem has been gradually worsening. Pertinent negatives include no chest pain. Nothing aggravates the symptoms. Nothing relieves the symptoms. She has tried nothing for the symptoms. The treatment provided no relief.   Pt complains of sore throat.  Pt reports throat is getting worse. Pt complains of trouble swallowing today.  Past Medical History:  Diagnosis Date  . Abnormal Pap smear    ASCUS  . Anemia   . Choroid plexus cysts, fetal, affecting care of mother, antepartum     Patient Active Problem List   Diagnosis Date Noted  . Active labor at term 08/11/2015  . Seasonal allergies 12/13/2011  . Annual physical exam 12/13/2011  . Underweight 12/13/2011    Past Surgical History:  Procedure Laterality Date  . NO PAST SURGERIES      OB History    Gravida Para Term Preterm AB Living   6 5 5     5    SAB TAB Ectopic Multiple Live Births         0 1       Home Medications    Prior to Admission medications   Medication Sig Start Date End Date Taking? Authorizing Provider  acetaminophen (TYLENOL) 325 MG tablet Take 2 tablets (650 mg total) by mouth every 6 (six) hours as needed (mild discomfortORtemperature>/= 100.4 F). 03/27/17   Petrucelli, Samantha R, PA-C  meclizine (ANTIVERT) 25 MG tablet Take 1 tablet (25 mg total) 3 (three) times daily as needed by mouth for dizziness. 11/21/16   Claiborne Rigg, NP  Prenatal Vit-Fe Fumarate-FA (PRENATAL MULTIVITAMIN) TABS tablet Take 1 tablet by mouth daily at 12 noon.    [provider]    Family History Family History  Problem Relation Age of Onset  . Asthma Brother   . Diabetes Paternal Grandfather     Social History Social History   Tobacco Use  . Smoking status: Never Smoker  . Smokeless tobacco: Never Used  Substance Use Topics  . Alcohol use: No  . Drug use: No     Allergies   Patient has no known allergies.   Review of Systems Review of Systems  Cardiovascular: Negative for chest pain.  All other systems reviewed and are negative.    Physical Exam Updated Vital Signs BP 93/65 (BP Location: Right Arm)   Pulse (!) 107   Temp 99.4 F (37.4 C) (Oral)   Resp (!) 28   Ht 5\' 6"  (1.676 m)   Wt 48.1 kg (106 lb)   LMP 12/01/2016 (Exact Date)   SpO2 100%   BMI 17.11 kg/m   Physical Exam  Constitutional: She appears well-developed and well-nourished. No distress.  HENT:  Head: Normocephalic and atraumatic.  Right Ear: Tympanic membrane normal.  Left Ear: Tympanic membrane normal.  Mouth/Throat: Uvula is midline and mucous membranes are normal. Posterior oropharyngeal edema present.  Eyes: Conjunctivae are normal.  Neck: Neck supple.  Cardiovascular: Normal rate and regular rhythm.  No murmur heard. Pulmonary/Chest: Effort normal and breath sounds normal. No  respiratory distress.  Abdominal: Soft. There is no tenderness.  Musculoskeletal: She exhibits no edema.  Neurological: She is alert.  Skin: Skin is warm and dry.  Psychiatric: She has a normal mood and affect.  Nursing note and vitals reviewed.    ED Treatments / Results  Labs (all labs ordered are listed, but only abnormal results are displayed) Labs Reviewed  COMPREHENSIVE METABOLIC PANEL - Abnormal; Notable for the following components:      Result Value   Glucose, Bld 104 (*)    ALT 12 (*)    All other components within normal limits  CBC WITH DIFFERENTIAL/PLATELET - Abnormal; Notable for the following components:   HCT 35.7 (*)    All other components within  normal limits  MONONUCLEOSIS SCREEN  I-STAT CG4 LACTIC ACID, ED  I-STAT CG4 LACTIC ACID, ED    EKG  EKG Interpretation None       Radiology No results found.  Procedures Procedures (including critical care time)  Medications Ordered in ED Medications  ondansetron (ZOFRAN-ODT) disintegrating tablet 4 mg (4 mg Oral Given 03/28/17 1422)  HYDROcodone-acetaminophen (NORCO/VICODIN) 5-325 MG per tablet 2 tablet (2 tablets Oral Given 03/28/17 1422)     Initial Impression / Assessment and Plan / ED Course  I have reviewed the triage vital signs and the nursing notes.  Pertinent labs & imaging results that were available during my care of the patient were reviewed by me and considered in my medical decision making (see chart for details).     Meds ordered this encounter  Medications  . ondansetron (ZOFRAN-ODT) disintegrating tablet 4 mg  . HYDROcodone-acetaminophen (NORCO/VICODIN) 5-325 MG per tablet 2 tablet  . penicillin v potassium (VEETID) 500 MG tablet    Sig: Take 1 tablet (500 mg total) by mouth 4 (four) times daily for 7 days.    Dispense:  40 tablet    Refill:  0    Order Specific Question:   Supervising Provider    Answer:   Eber HongMILLER, BRIAN [[id number]]    Final Clinical Impressions(s) / ED Diagnoses   Final diagnoses:  Tonsillitis    ED Discharge Orders    None    An After Visit Summary was printed and given to the patient.    Elson AreasSofia, Tomasz Steeves K, [place]PA-C 03/28/17 1540    Jacalyn LefevreHaviland, Julie, MD 03/28/17 [phone]

## 2017-03-28 NOTE — ED Notes (Signed)
minjimal engl;ish  Family not at the bedside

## 2017-03-28 NOTE — ED Notes (Signed)
Patient family at bedside states concerned patient is [redacted] weeks pregnant.

## 2017-03-28 NOTE — Discharge Instructions (Signed)
Return if any problems.  Drink plenty of fluids.   °

## 2017-03-28 NOTE — ED Notes (Signed)
Assumed care of pt for discharge.  Spoke with Dr. Particia NearingHaviland regarding work note.  Pt requests note from yesterday and go back Monday.  MD agreed.  Verbalized understanding of discharge instructions.  Epic was down and pt unable to sign electronic acknowledgement.

## 2017-03-28 NOTE — ED Triage Notes (Signed)
Pt reports being seen on 3/12 for sore throat and was negative for strep. Is approx [redacted] weeks pregnant. Having increase in sore throat, unable to swallow saliva at this time and reports n/v. Denies fever. Airway intact at triage.

## 2017-03-29 LAB — CULTURE, GROUP A STREP (THRC)

## 2017-04-19 LAB — OB RESULTS CONSOLE GC/CHLAMYDIA
CHLAMYDIA, DNA PROBE: NEGATIVE
GC PROBE AMP, GENITAL: NEGATIVE

## 2017-04-19 LAB — OB RESULTS CONSOLE ABO/RH: RH Type: POSITIVE

## 2017-04-19 LAB — OB RESULTS CONSOLE HIV ANTIBODY (ROUTINE TESTING): HIV: NONREACTIVE

## 2017-04-19 LAB — OB RESULTS CONSOLE RPR: RPR: NONREACTIVE

## 2017-04-19 LAB — OB RESULTS CONSOLE HEPATITIS B SURFACE ANTIGEN: Hepatitis B Surface Ag: NEGATIVE

## 2017-04-19 LAB — OB RESULTS CONSOLE RUBELLA ANTIBODY, IGM: RUBELLA: IMMUNE

## 2017-04-19 LAB — OB RESULTS CONSOLE ANTIBODY SCREEN: ANTIBODY SCREEN: NEGATIVE

## 2017-04-20 ENCOUNTER — Other Ambulatory Visit (HOSPITAL_COMMUNITY): Payer: Self-pay | Admitting: Nurse Practitioner

## 2017-04-20 DIAGNOSIS — Z369 Encounter for antenatal screening, unspecified: Secondary | ICD-10-CM

## 2017-05-02 ENCOUNTER — Encounter (HOSPITAL_COMMUNITY): Payer: Self-pay | Admitting: *Deleted

## 2017-05-03 ENCOUNTER — Other Ambulatory Visit (HOSPITAL_COMMUNITY): Payer: Self-pay | Admitting: Nurse Practitioner

## 2017-05-03 ENCOUNTER — Ambulatory Visit (HOSPITAL_COMMUNITY)
Admission: RE | Admit: 2017-05-03 | Discharge: 2017-05-03 | Disposition: A | Discharge status: Home / Self Care | Payer: Medicaid Other | Source: Ambulatory Visit | Attending: Nurse Practitioner | Admitting: Nurse Practitioner

## 2017-05-03 ENCOUNTER — Encounter (HOSPITAL_COMMUNITY): Payer: Self-pay

## 2017-05-03 DIAGNOSIS — O99011 Anemia complicating pregnancy, first trimester: Secondary | ICD-10-CM | POA: Diagnosis not present

## 2017-05-03 DIAGNOSIS — Z3A13 13 weeks gestation of pregnancy: Secondary | ICD-10-CM

## 2017-05-03 DIAGNOSIS — Z3682 Encounter for antenatal screening for nuchal translucency: Secondary | ICD-10-CM

## 2017-05-03 DIAGNOSIS — Z369 Encounter for antenatal screening, unspecified: Secondary | ICD-10-CM

## 2017-05-03 HISTORY — DX: Unspecified abnormal cytological findings in specimens from vagina: R87.629

## 2017-05-05 ENCOUNTER — Other Ambulatory Visit: Payer: Self-pay

## 2017-10-11 LAB — OB RESULTS CONSOLE GBS: GBS: NEGATIVE

## 2017-11-04 ENCOUNTER — Inpatient Hospital Stay (HOSPITAL_COMMUNITY)
Admission: AD | Admit: 2017-11-04 | Discharge: 2017-11-06 | DRG: 807 | Disposition: A | Discharge status: Home / Self Care | Payer: Medicaid Other | Attending: Family Medicine | Admitting: Family Medicine

## 2017-11-04 ENCOUNTER — Encounter (HOSPITAL_COMMUNITY): Payer: Self-pay | Admitting: Emergency Medicine

## 2017-11-04 DIAGNOSIS — Z3A39 39 weeks gestation of pregnancy: Secondary | ICD-10-CM

## 2017-11-04 DIAGNOSIS — Z3483 Encounter for supervision of other normal pregnancy, third trimester: Secondary | ICD-10-CM | POA: Diagnosis present

## 2017-11-04 DIAGNOSIS — O9902 Anemia complicating childbirth: Secondary | ICD-10-CM | POA: Diagnosis present

## 2017-11-04 DIAGNOSIS — D649 Anemia, unspecified: Secondary | ICD-10-CM | POA: Diagnosis present

## 2017-11-04 MED ORDER — LACTATED RINGERS IV SOLN
INTRAVENOUS | Status: DC
  Administered 2017-11-04: 22:00:00 via INTRAVENOUS

## 2017-11-04 MED ORDER — DIPHENHYDRAMINE HCL 25 MG PO CAPS: 25.0000 mg | ORAL_CAPSULE | Freq: Four times a day (QID) | ORAL | Status: DC | PRN

## 2017-11-04 MED ORDER — IBUPROFEN 600 MG PO TABS
600.0000 mg | ORAL_TABLET | Freq: Four times a day (QID) | ORAL | Status: DC
  Administered 2017-11-04 – 2017-11-06 (×7): 600 mg via ORAL
  Filled 2017-11-04 (×7): qty 1

## 2017-11-04 MED ORDER — ACETAMINOPHEN 325 MG PO TABS
650.0000 mg | ORAL_TABLET | ORAL | Status: DC | PRN
  Administered 2017-11-05 – 2017-11-06 (×3): 650 mg via ORAL
  Filled 2017-11-04 (×3): qty 2

## 2017-11-04 MED ORDER — ZOLPIDEM TARTRATE 5 MG PO TABS: 5.0000 mg | ORAL_TABLET | Freq: Every evening | ORAL | Status: DC | PRN

## 2017-11-04 MED ORDER — ONDANSETRON HCL 4 MG PO TABS: 4.0000 mg | ORAL_TABLET | ORAL | Status: DC | PRN

## 2017-11-04 MED ORDER — SIMETHICONE 80 MG PO CHEW: 80.0000 mg | CHEWABLE_TABLET | ORAL | Status: DC | PRN

## 2017-11-04 MED ORDER — OXYTOCIN 40 UNITS IN LACTATED RINGERS INFUSION - SIMPLE MED
INTRAVENOUS | Status: AC
  Administered 2017-11-04: 2.5 [IU]/h via INTRAVENOUS
  Filled 2017-11-04: qty 1000

## 2017-11-04 MED ORDER — ONDANSETRON HCL 4 MG/2ML IJ SOLN: 4.0000 mg | Freq: Four times a day (QID) | INTRAMUSCULAR | Status: DC | PRN

## 2017-11-04 MED ORDER — ONDANSETRON HCL 4 MG/2ML IJ SOLN: 4.0000 mg | INTRAMUSCULAR | Status: DC | PRN

## 2017-11-04 MED ORDER — OXYTOCIN 10 UNIT/ML IJ SOLN
INTRAMUSCULAR | Status: AC
  Filled 2017-11-04: qty 2

## 2017-11-04 MED ORDER — SENNOSIDES-DOCUSATE SODIUM 8.6-50 MG PO TABS
2.0000 | ORAL_TABLET | ORAL | Status: DC
  Administered 2017-11-05 (×2): 2 via ORAL
  Filled 2017-11-04 (×2): qty 2

## 2017-11-04 MED ORDER — PRENATAL MULTIVITAMIN CH
1.0000 | ORAL_TABLET | Freq: Every day | ORAL | Status: DC
  Administered 2017-11-05 – 2017-11-06 (×2): 1 via ORAL
  Filled 2017-11-04 (×2): qty 1

## 2017-11-04 MED ORDER — TETANUS-DIPHTH-ACELL PERTUSSIS 5-2.5-18.5 LF-MCG/0.5 IM SUSP
0.5000 mL | Freq: Once | INTRAMUSCULAR | Status: DC
  Filled 2017-11-04: qty 0.5

## 2017-11-04 MED ORDER — COCONUT OIL OIL
1.0000 "application " | TOPICAL_OIL | Status: DC | PRN
  Administered 2017-11-06: 1 via TOPICAL
  Filled 2017-11-04 (×2): qty 120

## 2017-11-04 MED ORDER — LIDOCAINE HCL (PF) 1 % IJ SOLN
INTRAMUSCULAR | Status: AC
  Filled 2017-11-04: qty 30

## 2017-11-04 MED ORDER — DIBUCAINE 1 % RE OINT
1.0000 "application " | TOPICAL_OINTMENT | RECTAL | Status: DC | PRN
  Filled 2017-11-04: qty 28

## 2017-11-04 MED ORDER — LACTATED RINGERS IV SOLN: 500.0000 mL | INTRAVENOUS | Status: DC | PRN

## 2017-11-04 MED ORDER — MISOPROSTOL 200 MCG PO TABS
ORAL_TABLET | ORAL | Status: AC
  Filled 2017-11-04: qty 5

## 2017-11-04 MED ORDER — SOD CITRATE-CITRIC ACID 500-334 MG/5ML PO SOLN: 30.0000 mL | ORAL | Status: DC | PRN

## 2017-11-04 MED ORDER — OXYCODONE-ACETAMINOPHEN 5-325 MG PO TABS: 2.0000 | ORAL_TABLET | ORAL | Status: DC | PRN

## 2017-11-04 MED ORDER — LIDOCAINE HCL (PF) 1 % IJ SOLN: 30.0000 mL | INTRAMUSCULAR | Status: DC | PRN

## 2017-11-04 MED ORDER — OXYTOCIN BOLUS FROM INFUSION
500.0000 mL | Freq: Once | INTRAVENOUS | Status: AC
  Administered 2017-11-04: 500 mL via INTRAVENOUS

## 2017-11-04 MED ORDER — OXYCODONE-ACETAMINOPHEN 5-325 MG PO TABS: 1.0000 | ORAL_TABLET | ORAL | Status: DC | PRN

## 2017-11-04 MED ORDER — BENZOCAINE-MENTHOL 20-0.5 % EX AERO
1.0000 "application " | INHALATION_SPRAY | CUTANEOUS | Status: DC | PRN
  Filled 2017-11-04: qty 56

## 2017-11-04 MED ORDER — ACETAMINOPHEN 325 MG PO TABS: 650.0000 mg | ORAL_TABLET | ORAL | Status: DC | PRN

## 2017-11-04 MED ORDER — SODIUM CHLORIDE 0.9 % IV SOLN
2.0000 g | Freq: Once | INTRAVENOUS | Status: DC
  Filled 2017-11-04: qty 2000

## 2017-11-04 MED ORDER — WITCH HAZEL-GLYCERIN EX PADS: 1.0000 "application " | MEDICATED_PAD | CUTANEOUS | Status: DC | PRN

## 2017-11-04 MED ORDER — OXYTOCIN 40 UNITS IN LACTATED RINGERS INFUSION - SIMPLE MED
2.5000 [IU]/h | INTRAVENOUS | Status: DC
  Administered 2017-11-04: 2.5 [IU]/h via INTRAVENOUS

## 2017-11-04 NOTE — Progress Notes (Signed)
Steward Drone, CNM in to assess uterine tone and bleeding

## 2017-11-04 NOTE — H&P (Signed)
LABOR AND DELIVERY ADMISSION HISTORY AND PHYSICAL NOTE  Shannon Hernandez is a 27 y.o. female G54P5005 with IUP at [redacted]w[redacted]d by LMP presenting for SOL.  She reports positive fetal movement. She denies leakage of fluid or vaginal bleeding.  Prenatal History/Complications: PNC at [facility] Pregnancy complications:  - Anemia during pregnancy   Past Medical History: Past Medical History:  Diagnosis Date  . Abnormal Pap smear    ASCUS  . Anemia   . Choroid plexus cysts, fetal, affecting care of mother, antepartum   . Vaginal Pap smear, abnormal     Past Surgical History: Past Surgical History:  Procedure Laterality Date  . NO PAST SURGERIES      Obstetrical History: OB History    Gravida  6   Para  5   Term  5   Preterm      AB      Living  5     SAB      TAB      Ectopic      Multiple  0   Live Births  1           Social History: Social History   Socioeconomic History  . Marital status: Married    Spouse name: Not on file  . Number of children: Not on file  . Years of education: Not on file  . Highest education level: Not on file  Occupational History  . Not on file  Social Needs  . Financial resource strain: Not on file  . Food insecurity:    Worry: Not on file    Inability: Not on file  . Transportation needs:    Medical: Not on file    Non-medical: Not on file  Tobacco Use  . Smoking status: Never Smoker  . Smokeless tobacco: Never Used  Substance and Sexual Activity  . Alcohol use: No  . Drug use: No  . Sexual activity: Yes    Birth control/protection: None  Lifestyle  . Physical activity:    Days per week: Not on file    Minutes per session: Not on file  . Stress: Not on file  Relationships  . Social connections:    Talks on phone: Not on file    Gets together: Not on file    Attends religious service: Not on file    Active member of club or organization: Not on file    Attends meetings of clubs or organizations: Not on file   Relationship status: Not on file  Other Topics Concern  . Not on file  Social History Narrative  . Not on file    Family History: Family History  Problem Relation Age of Onset  . Asthma Brother   . Diabetes Paternal Grandfather     Allergies: No Known Allergies  Facility-Administered Medications Prior to Admission  Medication Dose Route Frequency Provider Last Rate Last Dose  . sodium chloride 0.9 % bolus 1,000 mL  1,000 mL Intravenous Once Claiborne Rigg, NP       Medications Prior to Admission  Medication Sig Dispense Refill Last Dose  . acetaminophen (TYLENOL) 325 MG tablet Take 2 tablets (650 mg total) by mouth every 6 (six) hours as needed (mild discomfortORtemperature>/= 100.4 F). 30 tablet 0 Taking  . meclizine (ANTIVERT) 25 MG tablet Take 1 tablet (25 mg total) 3 (three) times daily as needed by mouth for dizziness. (Patient not taking: Reported on 05/03/2017) 30 tablet 0 Not Taking  . Prenatal Vit-Fe Fumarate-FA (  PRENATAL MULTIVITAMIN) TABS tablet Take 1 tablet by mouth daily at 12 noon.   Taking     Review of Systems  All systems reviewed and negative except as stated in HPI  Physical Exam Blood pressure 121/79, pulse (!) 111, temperature 98.2 F (36.8 C), temperature source Oral, resp. rate 17, last menstrual period 02/01/2017, unknown if currently breastfeeding. General appearance: alert, cooperative and mild distress due to contractions  Lungs: clear to auscultation bilaterally Heart: regular rate and rhythm Abdomen: soft, non-tender; bowel sounds normal Extremities: No calf swelling or tenderness Presentation: cephalic Fetal monitoring: 130/moderate/+accels/variable decelerations  Uterine activity: 2-3 minutes/ strong by palpation  Dilation: 8.5 Effacement (%): 90 Station: Plus 1, 0 Exam by:: Freddy Finner, RN   Prenatal labs: ABO, Rh: B/Positive/-- (04/04 0000) Antibody: neg (04/04 0000) Rubella: Immune (04/04 0000) RPR: Nonreactive (04/04  0000)  HBsAg: Negative (04/04 0000)  HIV: Non-reactive (04/04 0000)  GC/Chlamydia: Negative  GBS: Negative (09/26 0000)  1 hr Glucola: 77- normal  Genetic screening:  Negative  Anatomy US: Normal female   Prenatal Transfer Tool  Maternal Diabetes: No Genetic Screening: Normal Maternal Ultrasounds/Referrals: Normal Fetal Ultrasounds or other Referrals:  None Maternal Substance Abuse:  No Significant Maternal Medications:  None Significant Maternal Lab Results: Lab values include: Group B Strep negative  No results found for this or any previous visit (from the past 24 hour(s)).  Patient Active Problem List   Diagnosis Date Noted  . Normal labor 11/04/2017  . SVD (spontaneous vaginal delivery) 11/04/2017  . Precipitous delivery 11/04/2017  . Active labor at term 08/11/2015  . Seasonal allergies 12/13/2011  . Annual physical exam 12/13/2011  . Underweight 12/13/2011    Assessment: Shannon Hernandez is a 27 y.o. G6P5005 at [redacted]w[redacted]d here for SOL   #Labor: Expectant management  #Pain: Plans natural delivery  #FWB: Cat I #ID:  GBS negative  #MOF: Breast  #MOC: POPs and condoms  #Circ:  Yes, inpatient   [name], CNM 11/04/2017, 10:15 PM

## 2017-11-05 ENCOUNTER — Encounter (HOSPITAL_COMMUNITY): Payer: Self-pay | Admitting: Emergency Medicine

## 2017-11-05 LAB — CBC
HCT: 32.5 % — ABNORMAL LOW (ref 36.0–46.0)
Hemoglobin: 11.3 g/dL — ABNORMAL LOW (ref 12.0–15.0)
MCH: 31.7 pg (ref 26.0–34.0)
MCHC: 34.8 g/dL (ref 30.0–36.0)
MCV: 91 fL (ref 80.0–100.0)
Platelets: 196 10*3/uL (ref 150–400)
RBC: 3.57 MIL/uL — ABNORMAL LOW (ref 3.87–5.11)
RDW: 14.1 % (ref 11.5–15.5)
WBC: 10.6 10*3/uL — ABNORMAL HIGH (ref 4.0–10.5)
nRBC: 0 % (ref 0.0–0.2)

## 2017-11-05 LAB — TYPE AND SCREEN
ABO/RH(D): B POS
Antibody Screen: NEGATIVE

## 2017-11-05 LAB — RPR: RPR Ser Ql: NONREACTIVE

## 2017-11-05 NOTE — Lactation Note (Signed)
This note was copied from a baby's chart. Lactation Consultation Note  Patient Name: Shannon Hernandez Date: 11/05/2017 Reason for consult: Initial assessment;Term;Other (Comment)(per mom baby recently fed / per MBURN - formula was given mom formula at night / per mom baby didn't seem satisfied and sje asked for formula / encouraged mom to page with feeding cues )  Baby is 15 hours old , last breast fed at 0700 and 2 bottles since . Per mom MBURN mom asked for formula during the night and had just used it to feed the baby.  Mom is an experienced BF mother of 5.  LC reviewed supply and demand and recommended offering both breast and if the baby is satisfied to hold off on the supplementing, if not keep the supplementing low.  Per mom  active with WIC / [place].  Mother informed of post-discharge support and given phone number to the lactation department, including services for phone call assistance; out-patient appointments; and breastfeeding support group. List of other breastfeeding resources in the community given in the handout. Encouraged mother to call for problems or concerns related to breastfeeding.  LC encouraged mom to call with feeding cues for a feeding assessment.    Maternal Data Has patient been taught Hand Expression?: No(per mom knows how to hand express ) Does the patient have breastfeeding experience prior to this delivery?: Yes  Feeding Feeding Type: (per RN baby recently received a bottle ) Nipple Type: Slow - flow  LATCH Score                   Interventions Interventions: Breast feeding basics reviewed  Lactation Tools Discussed/Used WIC Program: Yes   Consult Status Consult Status: Follow-up Date: 11/05/17 Follow-up type: In-patient    Shannon Hernandez 11/05/2017, 1:50 PM

## 2017-11-05 NOTE — Progress Notes (Signed)
POSTPARTUM PROGRESS NOTE  Post Partum Day 1  Subjective:  Shannon Hernandez is a 27 y.o. [id number] s/p Precip SVD at [redacted]w[redacted]d.  No acute events overnight.  Pt denies problems with ambulating, voiding or po intake.  She denies nausea or vomiting.  Pain is well controlled.  She has not had flatus. She has not had bowel movement.  Lochia Moderate.   Objective: Blood pressure 101/64, pulse (!) 52, temperature 98 F (36.7 C), temperature source Oral, resp. rate 16, last menstrual period 02/01/2017, unknown if currently breastfeeding.  Physical Exam:  General: alert, cooperative and no distress Abdomen: soft, nontender,  Uterine Fundus: firm, appropriately tender DVT Evaluation: No calf swelling or tenderness Extremities: No edema  Recent Labs    11/05/17 0145  HGB 11.3*  HCT 32.5*    Assessment/Plan: Shannon Hernandez is a 27 y.o. [id number] s/p SVD at [redacted]w[redacted]d   PPD#1 - Doing well Contraception: Condoms Feeding: Breast Dispo: Plan for discharge tomorrow.   LOS: 1 day   Shannon Hernandez, CNM 11/05/2017, 7:51 AM

## 2017-11-06 MED ORDER — SENNOSIDES-DOCUSATE SODIUM 8.6-50 MG PO TABS: 2.0000 | ORAL_TABLET | ORAL | 0 refills | Status: DC

## 2017-11-06 MED ORDER — IBUPROFEN 600 MG PO TABS: 600.0000 mg | ORAL_TABLET | Freq: Four times a day (QID) | ORAL | 1 refills | Status: DC

## 2017-11-06 NOTE — Lactation Note (Signed)
This note was copied from a baby's chart. Lactation Consultation Note  Patient Name: Shannon Hernandez Date: 11/06/2017 Reason for consult: Follow-up assessment  Visited with P6 Mom of term baby at 23 hrs old, on day of discharge.  Baby at 2% weight loss.  Baby cueing and fussing in crib, Mom had just fed formula.  Recommended she breastfeed baby.   Mom denies any difficulty.  Encouraged STS. To call prn for concerns.   Consult Status Consult Status: Complete Date: 11/06/17 Follow-up type: Call as needed    Judee Clara 11/06/2017, 9:44 AM

## 2017-11-06 NOTE — Discharge Instructions (Signed)
Postpartum Care After Vaginal Delivery °The period of time right after you deliver your newborn is called the postpartum period. °What kind of medical care will I receive? °· You may continue to receive fluids and medicines through an IV tube inserted into one of your veins. °· If an incision was made near your vagina (episiotomy) or if you had some vaginal tearing during delivery, cold compresses may be placed on your episiotomy or your tear. This helps to reduce pain and swelling. °· You may be given a squirt bottle to use when you go to the bathroom. You may use this until you are comfortable wiping as usual. To use the squirt bottle, follow these steps: °? Before you urinate, fill the squirt bottle with warm water. Do not use hot water. °? After you urinate, while you are sitting on the toilet, use the squirt bottle to rinse the area around your urethra and vaginal opening. This rinses away any urine and blood. °? You may do this instead of wiping. As you start healing, you may use the squirt bottle before wiping yourself. Make sure to wipe gently. °? Fill the squirt bottle with clean water every time you use the bathroom. °· You will be given sanitary pads to wear. °How can I expect to feel? °· You may not feel the need to urinate for several hours after delivery. °· You will have some soreness and pain in your abdomen and vagina. °· If you are breastfeeding, you may have uterine contractions every time you breastfeed for up to several weeks postpartum. Uterine contractions help your uterus return to its normal size. °· It is normal to have vaginal bleeding (lochia) after delivery. The amount and appearance of lochia is often similar to a menstrual period in the first week after delivery. It will gradually decrease over the next few weeks to a dry, yellow-brown discharge. For most women, lochia stops completely by 6-8 weeks after delivery. Vaginal bleeding can vary from woman to woman. °· Within the first few  days after delivery, you may have breast engorgement. This is when your breasts feel heavy, full, and uncomfortable. Your breasts may also throb and feel hard, tightly stretched, warm, and tender. After this occurs, you may have milk leaking from your breasts. Your health care provider can help you relieve discomfort due to breast engorgement. Breast engorgement should go away within a few days. °· You may feel more sad or worried than normal due to hormonal changes after delivery. These feelings should not last more than a few days. If these feelings do not go away after several days, speak with your health care provider. °How should I care for myself? °· Tell your health care provider if you have pain or discomfort. °· Drink enough water to keep your urine clear or pale yellow. °· Wash your hands thoroughly with soap and water for at least 20 seconds after changing your sanitary pads, after using the toilet, and before holding or feeding your baby. °· If you are not breastfeeding, avoid touching your breasts a lot. Doing this can make your breasts produce more milk. °· If you become weak or lightheaded, or you feel like you might faint, ask for help before: °? Getting out of bed. °? Showering. °· Change your sanitary pads frequently. Watch for any changes in your flow, such as a sudden increase in volume, a change in color, the passing of large blood clots. If you pass a blood clot from your vagina, save it   to show to your health care provider. Do not flush blood clots down the toilet without having your health care provider look at them. °· Make sure that all your vaccinations are up to date. This can help protect you and your baby from getting certain diseases. You may need to have immunizations done before you leave the hospital. °· If desired, talk with your health care provider about methods of family planning or birth control (contraception). °How can I start bonding with my baby? °Spending as much time as  possible with your baby is very important. During this time, you and your baby can get to know each other and develop a bond. Having your baby stay with you in your room (rooming in) can give you time to get to know your baby. Rooming in can also help you become comfortable caring for your baby. Breastfeeding can also help you bond with your baby. °How can I plan for returning home with my baby? °· Make sure that you have a car seat installed in your vehicle. °? Your car seat should be checked by a certified car seat installer to make sure that it is installed safely. °? Make sure that your baby fits into the car seat safely. °· Ask your health care provider any questions you have about caring for yourself or your baby. Make sure that you are able to contact your health care provider with any questions after leaving the hospital. °This information is not intended to replace advice given to you by your health care provider. Make sure you discuss any questions you have with your health care provider. °Document Released: 10/30/2006 Document Revised: 06/07/2015 Document Reviewed: 12/07/2014 °Elsevier Interactive Patient Education © 2018 Elsevier Inc[employer] ° °

## 2017-11-06 NOTE — Discharge Summary (Signed)
OB Discharge Summary     Patient Name: Shannon Hernandez DOB: [birth year] MRN: [id number]  Date of admission: 11/04/2017 Delivering MD: [name]   Date of discharge: 11/06/2017  Admitting diagnosis: 39.4 WKS, CTXS Intrauterine pregnancy: [redacted]w[redacted]d     Secondary diagnosis:  Active Problems:   Normal labor   SVD (spontaneous vaginal delivery)   Precipitous delivery  Additional problems: Anemia during pregnancy, ASCUS PAP      Discharge diagnosis: Term Pregnancy Delivered                                                                                                Post partum procedures:None  Augmentation: None  Complications: None  Hospital course:  Onset of Labor With Vaginal Delivery     27 y.o. yo [id number] at [redacted]w[redacted]d was admitted in Active Labor on 11/04/2017. Patient had an uncomplicated labor course as follows:  Membrane Rupture Time/Date: After delivery, encaul delivery  Intrapartum Procedures: Episiotomy: None [1]                                         Lacerations:  None [1]  Patient had a delivery of a Viable infant. 11/04/2017  Information for the patient's newborn:  Shannon, Hernandez [[id number]]  Delivery Method: Vag-Spont    Pateint had an uncomplicated postpartum course.  She is ambulating, tolerating a regular diet, passing flatus, and urinating well. Patient is discharged home in stable condition on 11/06/17.   Physical exam  Vitals:   11/05/17 1338 11/05/17 1550 11/05/17 2218 11/06/17 0558  BP: 104/64 104/65 98/60 96/68   Pulse: (!) 57 (!) 59 63 (!) 54  Resp:  16 18 18   Temp:  98.7 F (37.1 C) 98.2 F (36.8 C) 97.8 F (36.6 C)  TempSrc:  Oral Oral Oral  SpO2: 100% 100%     General: alert and cooperative Lochia: appropriate Uterine Fundus: firm Incision: N/A DVT Evaluation: No evidence of DVT seen on physical exam. Labs: Lab Results  Component Value Date   WBC 10.6 (H) 11/05/2017   HGB 11.3 (L) 11/05/2017   HCT 32.5 (L) 11/05/2017   MCV  91.0 11/05/2017   PLT 196 11/05/2017   CMP Latest Ref Rng & Units 03/28/2017  Glucose 65 - 99 mg/dL 130(Q)  BUN 6 - 20 mg/dL 6  Creatinine 6.57 - 8.46 mg/dL 9.62  Sodium 952 - 841 mmol/L 137  Potassium 3.5 - 5.1 mmol/L 4.0  Chloride 101 - 111 mmol/L 104  CO2 22 - 32 mmol/L 22  Calcium 8.9 - 10.3 mg/dL 9.2  Total Protein 6.5 - 8.1 g/dL 7.2  Total Bilirubin 0.3 - 1.2 mg/dL 0.7  Alkaline Phos 38 - 126 U/L 42  AST 15 - 41 U/L 17  ALT 14 - 54 U/L 12(L)    Discharge instruction: per After Visit Summary and "Baby and Me Booklet".  After visit meds:  Allergies as of 11/06/2017   No Known Allergies     Medication List  TAKE these medications   ibuprofen 600 MG tablet Commonly known as:  ADVIL,MOTRIN Take 1 tablet (600 mg total) by mouth every 6 (six) hours.   prenatal multivitamin Tabs tablet Take 1 tablet by mouth daily at 12 noon.   senna-docusate 8.6-50 MG tablet Commonly known as:  Senokot-S Take 2 tablets by mouth daily. Start taking on:  11/07/2017       Diet: routine diet  Activity: Advance as tolerated. Pelvic rest for 6 weeks.   Outpatient follow up:4 weeks Follow up Appt:No future appointments. Follow up Visit:No follow-ups on file.  Postpartum contraception: Progesterone only pills and Condoms  Newborn Data: Live born female  Birth Weight: 8 lb 3.9 oz (3740 g) APGAR: 9, 10  Newborn Delivery   Birth date/time:  11/04/2017 21:54:00 Delivery type:  Vaginal, Spontaneous     Baby Feeding: Bottle and Breast Disposition:home with mother   11/06/2017 De Hollingshead, DO

## 2018-07-22 ENCOUNTER — Encounter: Payer: Self-pay | Admitting: Nurse Practitioner

## 2018-07-22 ENCOUNTER — Other Ambulatory Visit: Payer: Self-pay

## 2018-07-22 ENCOUNTER — Ambulatory Visit: Payer: Medicaid Other | Attending: Nurse Practitioner | Admitting: Nurse Practitioner

## 2018-07-22 DIAGNOSIS — Z30011 Encounter for initial prescription of contraceptive pills: Secondary | ICD-10-CM | POA: Diagnosis not present

## 2018-07-22 DIAGNOSIS — R739 Hyperglycemia, unspecified: Secondary | ICD-10-CM | POA: Diagnosis not present

## 2018-07-22 DIAGNOSIS — Z825 Family history of asthma and other chronic lower respiratory diseases: Secondary | ICD-10-CM | POA: Diagnosis not present

## 2018-07-22 DIAGNOSIS — R7309 Other abnormal glucose: Secondary | ICD-10-CM | POA: Diagnosis not present

## 2018-07-22 DIAGNOSIS — D72829 Elevated white blood cell count, unspecified: Secondary | ICD-10-CM | POA: Diagnosis not present

## 2018-07-22 DIAGNOSIS — Z3009 Encounter for other general counseling and advice on contraception: Secondary | ICD-10-CM

## 2018-07-22 DIAGNOSIS — Z124 Encounter for screening for malignant neoplasm of cervix: Secondary | ICD-10-CM

## 2018-07-22 MED ORDER — ORTHO-NOVUM 1/35 (28) 1-35 MG-MCG PO TABS: 1.0000 | ORAL_TABLET | Freq: Every day | ORAL | 11 refills | Status: DC

## 2018-07-22 NOTE — Progress Notes (Signed)
Virtual Visit via Telephone Note Due to national recommendations of social distancing due to [place] 19, telehealth visit is felt to be most appropriate for this patient at this time.  I discussed the limitations, risks, security and privacy concerns of performing an evaluation and management service by telephone and the availability of in person appointments. I also discussed with the patient that there may be a patient responsible charge related to this service. The patient expressed understanding and agreed to proceed.    I connected with Shannon Hernandez on 07/22/18  at   3:30 PM EDT  EDT by telephone and verified that I am speaking with the correct person using two identifiers.   Consent I discussed the limitations, risks, security and privacy concerns of performing an evaluation and management service by telephone and the availability of in person appointments. I also discussed with the patient that there may be a patient responsible charge related to this service. The patient expressed understanding and agreed to proceed.   Location of Patient: Private  Residence   Location of Provider: [facility]    Persons participating in Telemedicine visit: Geryl Rankins FNP-BC [place]    History of Present Illness: Telemedicine visit for: Birth Control  has a past medical history of Abnormal Pap smear, Anemia, Choroid plexus cysts, fetal, affecting care of mother, antepartum, and Vaginal Pap smear, abnormal.  Contraception Counseling: Patient presents for contraception counseling. The patient has no complaints today. The patient is sexually active. Pertinent past medical history: none. Gave birth 10-2017. Recently stopped breast feeding and is interested in starting oral contraceptives.       Past Medical History:  Diagnosis Date  . Abnormal Pap smear    ASCUS  . Anemia   . Choroid plexus cysts, fetal, affecting care of mother, antepartum    . Vaginal Pap smear, abnormal     Past Surgical History:  Procedure Laterality Date  . NO PAST SURGERIES      Family History  Problem Relation Age of Onset  . Asthma Brother   . Diabetes Paternal Grandfather     Social History   Socioeconomic History  . Marital status: Married    Spouse name: Not on file  . Number of children: 5  . Years of education: Not on file  . Highest education level: Not on file  Occupational History  . Not on file  Social Needs  . Financial resource strain: Not very hard  . Food insecurity    Worry: Never true    Inability: Never true  . Transportation needs    Medical: No    Non-medical: No  Tobacco Use  . Smoking status: Never Smoker  . Smokeless tobacco: Never Used  Substance and Sexual Activity  . Alcohol use: No  . Drug use: No  . Sexual activity: Yes    Birth control/protection: None  Lifestyle  . Physical activity    Days per week: Not on file    Minutes per session: Not on file  . Stress: Not on file  Relationships  . Social Herbalist on phone: Not on file    Gets together: Not on file    Attends religious service: Not on file    Active member of club or organization: Not on file    Attends meetings of clubs or organizations: Not on file    Relationship status: Not on file  Other Topics Concern  . Not  on file  Social History Narrative  . Not on file     Observations/Objective: Awake, alert and oriented x 3   Review of Systems  Constitutional: Negative for fever, malaise/fatigue and weight loss.  HENT: Negative.  Negative for nosebleeds.   Eyes: Negative.  Negative for blurred vision, double vision and photophobia.  Respiratory: Negative.  Negative for cough and shortness of breath.   Cardiovascular: Negative.  Negative for chest pain, palpitations and leg swelling.  Gastrointestinal: Negative.  Negative for heartburn, nausea and vomiting.  Musculoskeletal: Negative.  Negative for myalgias.  Neurological:  Negative.  Negative for dizziness, focal weakness, seizures and headaches.  Psychiatric/Behavioral: Negative.  Negative for suicidal ideas.    Assessment and Plan: Shannon Hernandez was seen today for establish care.  Diagnoses and all orders for this visit:  Oral contraception initial prescription -     norethindrone-ethinyl estradiol 1/35 (ORTHO-NOVUM 1/35, 28,) tablet; Take 1 tablet by mouth daily. -     POCT urine pregnancy; Future  Cervical cancer screening -     Ambulatory referral to Gynecology  Leukocytosis, unspecified type -     CBC; Future -     Lipid panel; Future  Elevated glucose -     Basic metabolic panel; Future     Follow Up Instructions Return if symptoms worsen or fail to improve.     I discussed the assessment and treatment plan with the patient. The patient was provided an opportunity to ask questions and all were answered. The patient agreed with the plan and demonstrated an understanding of the instructions.   The patient was advised to call back or seek an in-person evaluation if the symptoms worsen or if the condition fails to improve as anticipated.  I provided 18 minutes of non-face-to-face time during this encounter including median intraservice time, reviewing previous notes, labs, imaging, medications and explaining diagnosis and management.  Claiborne RiggZelda W Fleming, FNP-BC

## 2018-07-24 ENCOUNTER — Ambulatory Visit: Payer: Medicaid Other | Attending: Family Medicine

## 2018-07-24 ENCOUNTER — Other Ambulatory Visit: Payer: Self-pay

## 2018-07-24 DIAGNOSIS — Z30011 Encounter for initial prescription of contraceptive pills: Secondary | ICD-10-CM | POA: Diagnosis not present

## 2018-07-24 DIAGNOSIS — R7309 Other abnormal glucose: Secondary | ICD-10-CM

## 2018-07-24 DIAGNOSIS — D72829 Elevated white blood cell count, unspecified: Secondary | ICD-10-CM

## 2018-07-24 LAB — POCT URINE PREGNANCY: Preg Test, Ur: NEGATIVE

## 2018-07-25 ENCOUNTER — Telehealth: Payer: Self-pay | Admitting: Nurse Practitioner

## 2018-07-25 LAB — BASIC METABOLIC PANEL
BUN/Creatinine Ratio: 16 (ref 9–23)
BUN: 12 mg/dL (ref 6–20)
CO2: 23 mmol/L (ref 20–29)
Calcium: 9 mg/dL (ref 8.7–10.2)
Chloride: 101 mmol/L (ref 96–106)
Creatinine, Ser: 0.77 mg/dL (ref 0.57–1.00)
GFR calc Af Amer: 120 mL/min/{1.73_m2} (ref >59)
GFR calc non Af Amer: 104 mL/min/{1.73_m2} (ref >59)
Glucose: 86 mg/dL (ref 65–99)
Potassium: 4.1 mmol/L (ref 3.5–5.2)
Sodium: 137 mmol/L (ref 134–144)

## 2018-07-25 LAB — CBC
Hematocrit: 37.1 % (ref 34.0–46.6)
Hemoglobin: 12.9 g/dL (ref 11.1–15.9)
MCH: 31.2 pg (ref 26.6–33.0)
MCHC: 34.8 g/dL (ref 31.5–35.7)
MCV: 90 fL (ref 79–97)
Platelets: 208 10*3/uL (ref 150–450)
RBC: 4.14 x10E6/uL (ref 3.77–5.28)
RDW: 12 % (ref 11.7–15.4)
WBC: 5.3 10*3/uL (ref 3.4–10.8)

## 2018-07-25 LAB — LIPID PANEL
Chol/HDL Ratio: 3.2 ratio (ref 0.0–4.4)
Cholesterol, Total: 144 mg/dL (ref 100–199)
HDL: 45 mg/dL (ref >39)
LDL Calculated: 80 mg/dL (ref 0–99)
Triglycerides: 94 mg/dL (ref 0–149)
VLDL Cholesterol Cal: 19 mg/dL (ref 5–40)

## 2018-07-25 NOTE — Telephone Encounter (Signed)
CMA spoke to patient to inform on lab results. Pt. Understood.

## 2018-07-25 NOTE — Telephone Encounter (Signed)
Patient called for their lab results. Please follow up.  °

## 2018-07-25 NOTE — Progress Notes (Signed)
CMA attempt to reach patient to inform on results.  No answer and left a VM.  

## 2018-07-29 NOTE — Telephone Encounter (Signed)
Patient called wanting to ask questions about her results. Please follow up

## 2018-07-30 NOTE — Telephone Encounter (Signed)
CMA spoke to patient to inform on her lab results.  Pt. Verified DOB. Pt. Understood.  Pt. Have no further question/concerns.

## 2018-07-30 NOTE — Progress Notes (Signed)
CMA spoke to patient to inform on results.  Pt. Understood. Pt. Verified DOB.  

## 2019-01-17 NOTE — L&D Delivery Note (Deleted)
Shannon Hernandez is a 29 y.o. G7P6 @ s/p vaginal delivery at [redacted]w[redacted]d.  She was admitted for spontaneous labor.    ROM: 0h 29m with light muconium stained fluid GBS Status: negative Maximum Maternal Temperature: not measured, quick delivery   Labor Progress: Pt presented to MAU as G7P6 in active labor on admission at 8/80/0. She desired AROM, performed at 2144. She then progressed quickly to complete and delivered without complication as noted below.   Delivery Date/Time: 11/12/2019 at 2153 Delivery: Called to room and patient was complete and pushing. Head delivered middle-OA. Tight nuchal cord present x1, unable to quickly reduce, delivered through. Shoulder and body delivered in usual fashion. Infant with spontaneous cry, placed on mother's abdomen, dried and stimulated. Cord clamped x 2 after 1-minute delay, and cut by FOB under my direct supervision. Cord blood drawn. Placenta delivered spontaneously with gentle cord traction. Fundus firm with massage and Pitocin. Labia, perineum, vagina, and cervix were inspected; no lacerations visualized.    Placenta: intact, 3-vessel cord, sent to L&D Complications: Tight nuchal cord x1, unable to quickly reduce, delivered through Lacerations: none EBL: 50 ml Analgesia: none   Infant: female   APGARs 9 & 9   weight per medical record  Fayette Pho, MD

## 2019-01-17 NOTE — L&D Delivery Note (Addendum)
Shannon Hernandez is a 29 y.o. G7P6 @ s/p vaginal delivery at [redacted]w[redacted]d.  She was admitted for spontaneous labor.    ROM: 0h 103m with light muconium stained fluid GBS Status: negative Maximum Maternal Temperature: not measured, quick delivery   Labor Progress: Pt presented to MAU as G7P6 in active labor on admission at 8/80/0. She desired AROM, performed at 2144. She then progressed quickly to complete and delivered without complication as noted below.   Delivery Date/Time: 11/12/2019 at 2153 Delivery: Called to room and patient was complete and pushing. Head delivered middle-OA. Tight nuchal cord present x1, unable to quickly reduce, delivered through. Shoulder and body delivered in usual fashion. Infant with spontaneous cry, placed on mother's abdomen, dried and stimulated. Cord clamped x 2 after 1-minute delay, and cut by FOB under my direct supervision. Cord blood drawn. Placenta delivered spontaneously with gentle cord traction. Fundus firm with massage and Pitocin. Labia, perineum, vagina, and cervix were inspected; no lacerations visualized.    Placenta: intact, 3-vessel cord, sent to L&D Complications: Tight nuchal cord x1, unable to quickly reduce, delivered through Lacerations: none EBL: 50 ml Analgesia: epidural   Infant: female  APGARs 9 & 9  weight per medical record  Fayette Pho, MD

## 2019-11-12 ENCOUNTER — Other Ambulatory Visit: Payer: Self-pay

## 2019-11-12 ENCOUNTER — Encounter (HOSPITAL_COMMUNITY): Payer: Self-pay

## 2019-11-12 ENCOUNTER — Inpatient Hospital Stay (HOSPITAL_COMMUNITY)
Admission: AD | Admit: 2019-11-12 | Discharge: 2019-11-14 | DRG: 807 | Disposition: A | Discharge status: Home / Self Care | Payer: Medicaid Other | Attending: Obstetrics & Gynecology | Admitting: Obstetrics & Gynecology

## 2019-11-12 DIAGNOSIS — Z3490 Encounter for supervision of normal pregnancy, unspecified, unspecified trimester: Secondary | ICD-10-CM

## 2019-11-12 DIAGNOSIS — Z3A39 39 weeks gestation of pregnancy: Secondary | ICD-10-CM

## 2019-11-12 DIAGNOSIS — Z20822 Contact with and (suspected) exposure to covid-19: Secondary | ICD-10-CM | POA: Diagnosis present

## 2019-11-12 DIAGNOSIS — O26893 Other specified pregnancy related conditions, third trimester: Secondary | ICD-10-CM | POA: Diagnosis present

## 2019-11-12 LAB — CBC
HCT: 33.9 % — ABNORMAL LOW (ref 36.0–46.0)
Hemoglobin: 10.7 g/dL — ABNORMAL LOW (ref 12.0–15.0)
MCH: 27.9 pg (ref 26.0–34.0)
MCHC: 31.6 g/dL (ref 30.0–36.0)
MCV: 88.3 fL (ref 80.0–100.0)
Platelets: 225 10*3/uL (ref 150–400)
RBC: 3.84 MIL/uL — ABNORMAL LOW (ref 3.87–5.11)
RDW: 16.8 % — ABNORMAL HIGH (ref 11.5–15.5)
WBC: 8.8 10*3/uL (ref 4.0–10.5)
nRBC: 0 % (ref 0.0–0.2)

## 2019-11-12 LAB — TYPE AND SCREEN
ABO/RH(D): B POS
Antibody Screen: NEGATIVE

## 2019-11-12 LAB — RESPIRATORY PANEL BY RT PCR (FLU A&B, COVID)
Influenza A by PCR: NEGATIVE
Influenza B by PCR: NEGATIVE
SARS Coronavirus 2 by RT PCR: NEGATIVE

## 2019-11-12 MED ORDER — OXYTOCIN-SODIUM CHLORIDE 30-0.9 UT/500ML-% IV SOLN
2.5000 [IU]/h | INTRAVENOUS | Status: DC
  Administered 2019-11-12: 2.5 [IU]/h via INTRAVENOUS

## 2019-11-12 MED ORDER — ONDANSETRON HCL 4 MG/2ML IJ SOLN: 4.0000 mg | Freq: Four times a day (QID) | INTRAMUSCULAR | Status: DC | PRN

## 2019-11-12 MED ORDER — OXYTOCIN-SODIUM CHLORIDE 30-0.9 UT/500ML-% IV SOLN
INTRAVENOUS | Status: AC
  Filled 2019-11-12: qty 500

## 2019-11-12 MED ORDER — LACTATED RINGERS IV SOLN: 500.0000 mL | INTRAVENOUS | Status: DC | PRN

## 2019-11-12 MED ORDER — OXYCODONE-ACETAMINOPHEN 5-325 MG PO TABS: 1.0000 | ORAL_TABLET | ORAL | Status: DC | PRN

## 2019-11-12 MED ORDER — FLEET ENEMA 7-19 GM/118ML RE ENEM: 1.0000 | ENEMA | RECTAL | Status: DC | PRN

## 2019-11-12 MED ORDER — ACETAMINOPHEN 325 MG PO TABS: 650.0000 mg | ORAL_TABLET | ORAL | Status: DC | PRN

## 2019-11-12 MED ORDER — OXYTOCIN BOLUS FROM INFUSION
333.0000 mL | Freq: Once | INTRAVENOUS | Status: AC
  Administered 2019-11-12: 333 mL via INTRAVENOUS

## 2019-11-12 MED ORDER — OXYCODONE-ACETAMINOPHEN 5-325 MG PO TABS: 2.0000 | ORAL_TABLET | ORAL | Status: DC | PRN

## 2019-11-12 MED ORDER — LIDOCAINE HCL (PF) 1 % IJ SOLN: 30.0000 mL | INTRAMUSCULAR | Status: DC | PRN

## 2019-11-12 MED ORDER — LACTATED RINGERS IV SOLN: INTRAVENOUS | Status: DC

## 2019-11-12 MED ORDER — FENTANYL CITRATE (PF) 100 MCG/2ML IJ SOLN: 50.0000 ug | INTRAMUSCULAR | Status: DC | PRN

## 2019-11-12 MED ORDER — SOD CITRATE-CITRIC ACID 500-334 MG/5ML PO SOLN: 30.0000 mL | ORAL | Status: DC | PRN

## 2019-11-12 NOTE — MAU Note (Signed)
Pt reports ctx's started yesterday now closer and stronger.   Denies LOF.   Denies vaginal bleeding.   Reports +FM

## 2019-11-12 NOTE — MAU Note (Signed)
Pt in active labor expedited transfer to l&d.   Wynelle Bourgeois, CNM at bedside for transport

## 2019-11-12 NOTE — H&P (Deleted)
Obstetric History and Physical  Shannon Hernandez is a 29 y.o. [id number] with IUP at [redacted]w[redacted]d presenting in active labor. Patient states she has been having  regular, every 4-5 minutes contractions, minimal vaginal bleeding, intact membranes, with active fetal movement.    Prenatal Course Source of Care: [facility]  Pregnancy complications or risks: Patient Active Problem List   Diagnosis Date Noted  . Pregnancy 11/12/2019  . Normal labor 11/04/2017  . SVD (spontaneous vaginal delivery) 11/04/2017  . Precipitous delivery 11/04/2017  . Active labor at term 08/11/2015  . Seasonal allergies 12/13/2011  . Annual physical exam 12/13/2011  . Underweight 12/13/2011   She plans to breastfeed, plans to bottle feed She desires Depo-Provera, or Nexplanon for postpartum contraception.   Prenatal labs and studies: ABO, Rh:  B positive Antibody:  negative Rubella:  immune RPR:  NR  HBsAg:  neg HIV:  NR  GBS: neg 1 hr Glucola  79 Genetic screening normal Anatomy US normal  Prenatal Transfer Tool  Maternal Diabetes: No Genetic Screening: Normal Maternal Ultrasounds/Referrals: Normal Fetal Ultrasounds or other Referrals:  None Maternal Substance Abuse:  No Significant Maternal Medications:  None Significant Maternal Lab Results: Group B Strep negative  Past Medical History:  Diagnosis Date  . Abnormal Pap smear    ASCUS  . Anemia   . Choroid plexus cysts, fetal, affecting care of mother, antepartum   . Vaginal Pap smear, abnormal     Past Surgical History:  Procedure Laterality Date  . NO PAST SURGERIES      OB History  Gravida Para Term Preterm AB Living  7 6 6     6   SAB TAB Ectopic Multiple Live Births        0 6    # Outcome Date GA Lbr Len/2nd Weight Sex Delivery Anes PTL Lv  7 Current           6 Term 11/04/17 [redacted]w[redacted]d 02:50 / 00:04 3740 g M Vag-Spont None  LIV  5 Term 08/11/15 [redacted]w[redacted]d 10:12 / 00:10 3195 g F Vag-Spont None  LIV  4 Term 02/21/14 [redacted]w[redacted]d 05:52 / 00:04 3130 g F  Vag-Spont None  LIV  3 Term 02/09/11 [redacted]w[redacted]d 01:30 / 00:25 3830 g F Vag-Spont None  LIV  2 Term 2012 [redacted]w[redacted]d 02:00 3374 g F Vag-Spont   LIV  1 Term 2010 [redacted]w[redacted]d  3487 g M Vag-Spont   LIV    Social History   Socioeconomic History  . Marital status: Married    Spouse name: Not on file  . Number of children: 5  . Years of education: Not on file  . Highest education level: Not on file  Occupational History  . Not on file  Tobacco Use  . Smoking status: Never Smoker  . Smokeless tobacco: Never Used  Substance and Sexual Activity  . Alcohol use: No  . Drug use: No  . Sexual activity: Yes    Birth control/protection: None  Other Topics Concern  . Not on file  Social History Narrative  . Not on file   Social Determinants of Health   Financial Resource Strain:   . Difficulty of Paying Living Expenses: Not on file  Food Insecurity:   . Worried About [redacted]w[redacted]d in the Last Year: Not on file  . Ran Out of Food in the Last Year: Not on file  Transportation Needs:   . Lack of Transportation (Medical): Not on file  . Lack of Transportation (Non-Medical): Not on file  Physical Activity:   . Days of Exercise per Week: Not on file  . Minutes of Exercise per Session: Not on file  Stress:   . Feeling of Stress : Not on file  Social Connections:   . Frequency of Communication with Friends and Family: Not on file  . Frequency of Social Gatherings with Friends and Family: Not on file  . Attends Religious Services: Not on file  . Active Member of Clubs or Organizations: Not on file  . Attends Banker Meetings: Not on file  . Marital Status: Not on file    Family History  Problem Relation Age of Onset  . Asthma Brother   . Diabetes Paternal Grandfather     Facility-Administered Medications Prior to Admission  Medication Dose Route Frequency Provider Last Rate Last Admin  . sodium chloride 0.9 % bolus 1,000 mL  1,000 mL Intravenous Once Claiborne Rigg, NP        Medications Prior to Admission  Medication Sig Dispense Refill Last Dose  . norethindrone-ethinyl estradiol 1/35 (ORTHO-NOVUM 1/35, 28,) tablet Take 1 tablet by mouth daily. 1 Package 11   . Prenatal Vit-Fe Fumarate-FA (PRENATAL MULTIVITAMIN) TABS tablet Take 1 tablet by mouth daily at 12 noon.       No Known Allergies  Review of Systems: Negative except for what is mentioned in HPI.  Physical Exam: Ht 5\' 6"  (1.676 m)   Wt 64 kg   BMI 22.76 kg/m  CONSTITUTIONAL: Well-developed, well-nourished female NECK: Normal range of motion, supple, no masses SKIN: Skin is warm and dry. No rash noted. Not diaphoretic. No erythema. No pallor. NEUROLOGIC: Alert and oriented to person, place, and time. Normal reflexes, muscle tone coordination. No cranial nerve deficit noted. PSYCHIATRIC: Normal mood and affect. Normal behavior. Normal judgment and thought content. CARDIOVASCULAR: Normal heart rate noted, regular rhythm RESPIRATORY: Effort and breath sounds normal, no problems with respiration noted ABDOMEN: Soft, nontender, nondistended, gravid. MUSCULOSKELETAL: Normal range of motion. No edema and no tenderness. 2+ distal pulses.  Cervical Exam: Dilatation 8cm   Effacement 80%   Station 0   Presentation: Cephalic FHT:  Baseline rate 140 bpm   Variability moderate  Accelerations present   Decelerations none Contractions: Every 4-5 mins   Pertinent Labs/Studies:   No results found for this or any previous visit (from the past 24 hour(s)).  Assessment : Shannon Hernandez is a 29 y.o. G7P6006 at [redacted]w[redacted]d being admitted for active labor  Plan: Labor: Expectant management, will likely deliver soon FWB: Reassuring fetal heart tracing.  GBS negative Delivery plan: Hopeful for vaginal delivery   [redacted]w[redacted]d, MD, FACOG Obstetrician & Gynecologist, [facility] for [facility]

## 2019-11-12 NOTE — Discharge Instructions (Signed)

## 2019-11-12 NOTE — Discharge Summary (Signed)
Postpartum Discharge Summary       Patient Name: Shannon Hernandez DOB: Feb 28, 1990 MRN: [id number]  Date of admission: 11/12/2019 Delivery date:11/12/2019  Delivering provider: Ezequiel Essex  Date of discharge: 11/14/2019  Admitting diagnosis: Pregnancy [Z34.90] Intrauterine pregnancy: [redacted]w[redacted]d    Secondary diagnosis:  Active Problems:   SVD (spontaneous vaginal delivery)   GChaunceymultipara in labor  Additional problems: hx abnl Pap    Discharge diagnosis: Term Pregnancy Delivered                                              Post partum procedures:none Augmentation: AROM Complications: None  Hospital course: Onset of Labor With Vaginal Delivery      29y.o. yo [id number] 344w2das admitted in Active Labor on 11/12/2019. Patient had an uncomplicated labor course which started yesterday but increased in the last few hours. She progressed to delivery shortly after AROM, as follows:  Membrane Rupture Time/Date: 9:44 PM ,11/12/2019   Delivery Method:Vaginal, Spontaneous  Episiotomy: None  Lacerations:  None  Patient had an uncomplicated postpartum course.  She is ambulating, tolerating a regular diet, passing flatus, and urinating well. Patient is discharged home in stable condition on 11/14/19.  Newborn Data: Birth date:11/12/2019  Birth time:9:53 PM  Gender:Female  Living status:Living  Apgars:9 ,9  Weight:3700 g  3700gm (8lb 2.5oz)  Magnesium Sulfate received: No BMZ received: No Rhophylac:N/A MMR:N/A T-DaP:Given prenatally Flu: No Transfusion:No  Physical exam  Vitals:   11/13/19 0945 11/13/19 1100 11/13/19 1228 11/14/19 0528  BP: 104/70  (!) 97/56 (!) 93/57  Pulse: 62  (!) 55 (!) 55  Resp: '18 18 18 17  ' Temp: 98.6 F (37 C)  97.7 F (36.5 C) 98.3 F (36.8 C)  TempSrc: Oral  Oral Oral  SpO2:    100%  Weight:      Height:       General: alert, cooperative and no distress Lochia: appropriate Uterine Fundus: firm Incision: N/A DVT Evaluation: No evidence  of DVT seen on physical exam. Negative Homan's sign. No cords or calf tenderness. No significant calf/ankle edema. Labs: Lab Results  Component Value Date   WBC 8.8 11/12/2019   HGB 10.7 (L) 11/12/2019   HCT 33.9 (L) 11/12/2019   MCV 88.3 11/12/2019   PLT 225 11/12/2019   CMP Latest Ref Rng & Units 07/24/2018  Glucose 65 - 99 mg/dL 86  BUN 6 - 20 mg/dL 12  Creatinine 0.57 - 1.00 mg/dL 0.77  Sodium 134 - 144 mmol/L 137  Potassium 3.5 - 5.2 mmol/L 4.1  Chloride 96 - 106 mmol/L 101  CO2 20 - 29 mmol/L 23  Calcium 8.7 - 10.2 mg/dL 9.0  Total Protein 6.5 - 8.1 g/dL -  Total Bilirubin 0.3 - 1.2 mg/dL -  Alkaline Phos 38 - 126 U/L -  AST 15 - 41 U/L -  ALT 14 - 54 U/L -   Edinburgh Score: Edinburgh Postnatal Depression Scale Screening Tool 11/13/2019  I have been able to laugh and see the funny side of things. 0  I have looked forward with enjoyment to things. 0  I have blamed myself unnecessarily when things went wrong. 0  I have been anxious or worried for no good reason. 0  I have felt scared or panicky for no good reason. 0  Things have been getting on  top of me. 0  I have been so unhappy that I have had difficulty sleeping. 0  I have felt sad or miserable. 0  I have been so unhappy that I have been crying. 0  The thought of harming myself has occurred to me. 0  Edinburgh Postnatal Depression Scale Total 0     After visit meds:  Allergies as of 11/14/2019   No Known Allergies     Medication List    STOP taking these medications   Ortho-Novum 1/35 (28) tablet Generic drug: norethindrone-ethinyl estradiol 1/35   prenatal multivitamin Tabs tablet     TAKE these medications   ibuprofen 600 MG tablet Commonly known as: ADVIL Take 1 tablet (600 mg total) by mouth every 6 (six) hours.        Discharge home in stable condition Infant Feeding: Bottle and Breast Infant Disposition:home with mother Discharge instruction: per After Visit Summary and Postpartum  booklet. Activity: Advance as tolerated. Pelvic rest for 6 weeks.  Diet: routine diet Future Appointments:No future appointments. Follow up Visit:  Follow-up Information    Department, [facility]. Schedule an appointment as soon as possible for a visit in 4 week(s).   Why: for your postpartum appointment Contact information: [address] [place] [address] [phone]                11/14/2019 Christin Fudge, CNM

## 2019-11-12 NOTE — MAU Note (Signed)
Covid swab obtained without difficulty. NO symptoms

## 2019-11-12 NOTE — H&P (Signed)
Shannon Hernandez is a 29 y.o. female [phone] @ 39.2wks presenting for reg ctx. Denies leaking or bldg; no H/A, N/V or visual disturbances. Her preg has been followed by the [facility] and has been remarkable for 1) grandmultip (VD x 6)  2) hx abnl Pap (unable to locate records)  OB History    Gravida  7   Para  6   Term  6   Preterm      AB      Living  6     SAB      TAB      Ectopic      Multiple  0   Live Births  6          Past Medical History:  Diagnosis Date  . Abnormal Pap smear    ASCUS  . Anemia   . Choroid plexus cysts, fetal, affecting care of mother, antepartum   . Vaginal Pap smear, abnormal    Past Surgical History:  Procedure Laterality Date  . NO PAST SURGERIES     Family History: family history includes Asthma in her brother; Diabetes in her paternal grandfather. Social History:  reports that she has never smoked. She has never used smokeless tobacco. She reports that she does not drink alcohol and does not use drugs.     Maternal Diabetes: No Genetic Screening: Normal Maternal Ultrasounds/Referrals: Normal Fetal Ultrasounds or other Referrals:  None Maternal Substance Abuse:  No Significant Maternal Medications:  None Significant Maternal Lab Results:  Group B Strep negative Other Comments:  None  Review of Systems History Dilation: Lip/rim Effacement (%): 100 Station: 0 Exam by:: B McClam, RN  Height 5\' 6"  (1.676 m), weight 64 kg, unknown if currently breastfeeding. Maternal Exam:  Introitus: Normal vulva.   Physical Exam Constitutional:      Appearance: Normal appearance.  HENT:     Head: Normocephalic.     Mouth/Throat:     Mouth: Mucous membranes are moist.  Cardiovascular:     Rate and Rhythm: Normal rate.     Pulses: Normal pulses.  Pulmonary:     Effort: Pulmonary effort is normal.  Abdominal:     Comments: EFM 135-145, +accels, no decels, occ variables Ctx q 2-3 mins  Genitourinary:    General: Normal vulva.   Musculoskeletal:        General: Normal range of motion.     Cervical back: Normal range of motion.  Skin:    General: Skin is warm and dry.  Neurological:     General: No focal deficit present.     Mental Status: She is alert.  Psychiatric:        Mood and Affect: Mood normal.        Behavior: Behavior normal.     Prenatal labs: (April 20, 2019) ABO, Rh:  B+ Antibody:  neg Rubella:  immune RPR:   NR HBsAg:   neg HIV:   NR GBS:   neg (10/12/19)  Assessment/Plan: IUP@39 .2wks Active labor/transition GBS neg  Admit to Labor and Delivery Prepare for birth Preparation for PPH given grandmultip status    10/14/19 CNM 11/12/2019, 9:46 PM

## 2019-11-13 ENCOUNTER — Encounter (HOSPITAL_COMMUNITY): Payer: Self-pay | Admitting: Obstetrics & Gynecology

## 2019-11-13 LAB — RPR: RPR Ser Ql: NONREACTIVE

## 2019-11-13 MED ORDER — ZOLPIDEM TARTRATE 5 MG PO TABS: 5.0000 mg | ORAL_TABLET | Freq: Every evening | ORAL | Status: DC | PRN

## 2019-11-13 MED ORDER — MEASLES, MUMPS & RUBELLA VAC IJ SOLR: 0.5000 mL | Freq: Once | INTRAMUSCULAR | Status: DC

## 2019-11-13 MED ORDER — SENNOSIDES-DOCUSATE SODIUM 8.6-50 MG PO TABS
2.0000 | ORAL_TABLET | ORAL | Status: DC
  Administered 2019-11-13 – 2019-11-14 (×2): 2 via ORAL
  Filled 2019-11-13 (×2): qty 2

## 2019-11-13 MED ORDER — ACETAMINOPHEN 325 MG PO TABS
650.0000 mg | ORAL_TABLET | ORAL | Status: DC | PRN
  Administered 2019-11-13: 650 mg via ORAL
  Filled 2019-11-13: qty 2

## 2019-11-13 MED ORDER — COCONUT OIL OIL: 1.0000 "application " | TOPICAL_OIL | Status: DC | PRN

## 2019-11-13 MED ORDER — DIPHENHYDRAMINE HCL 25 MG PO CAPS: 25.0000 mg | ORAL_CAPSULE | Freq: Four times a day (QID) | ORAL | Status: DC | PRN

## 2019-11-13 MED ORDER — OXYCODONE HCL 5 MG PO TABS: 5.0000 mg | ORAL_TABLET | ORAL | Status: DC | PRN

## 2019-11-13 MED ORDER — ONDANSETRON HCL 4 MG/2ML IJ SOLN: 4.0000 mg | INTRAMUSCULAR | Status: DC | PRN

## 2019-11-13 MED ORDER — DIBUCAINE (PERIANAL) 1 % EX OINT: 1.0000 "application " | TOPICAL_OINTMENT | CUTANEOUS | Status: DC | PRN

## 2019-11-13 MED ORDER — PRENATAL MULTIVITAMIN CH
1.0000 | ORAL_TABLET | Freq: Every day | ORAL | Status: DC
  Administered 2019-11-13: 1 via ORAL
  Filled 2019-11-13: qty 1

## 2019-11-13 MED ORDER — SIMETHICONE 80 MG PO CHEW: 80.0000 mg | CHEWABLE_TABLET | ORAL | Status: DC | PRN

## 2019-11-13 MED ORDER — IBUPROFEN 600 MG PO TABS
600.0000 mg | ORAL_TABLET | Freq: Four times a day (QID) | ORAL | Status: DC
  Administered 2019-11-13 – 2019-11-14 (×6): 600 mg via ORAL
  Filled 2019-11-13 (×6): qty 1

## 2019-11-13 MED ORDER — ONDANSETRON HCL 4 MG PO TABS: 4.0000 mg | ORAL_TABLET | ORAL | Status: DC | PRN

## 2019-11-13 MED ORDER — WITCH HAZEL-GLYCERIN EX PADS: 1.0000 "application " | MEDICATED_PAD | CUTANEOUS | Status: DC | PRN

## 2019-11-13 MED ORDER — TETANUS-DIPHTH-ACELL PERTUSSIS 5-2.5-18.5 LF-MCG/0.5 IM SUSY: 0.5000 mL | PREFILLED_SYRINGE | Freq: Once | INTRAMUSCULAR | Status: DC

## 2019-11-13 MED ORDER — BENZOCAINE-MENTHOL 20-0.5 % EX AERO: 1.0000 "application " | INHALATION_SPRAY | CUTANEOUS | Status: DC | PRN

## 2019-11-13 NOTE — Progress Notes (Signed)
Post Partum Day #1 Subjective: no complaints, up ad lib and tolerating PO; breastfeeding going well; declines contraception; would like circumcision for her son  Objective: Blood pressure 104/70, pulse 62, temperature 98.6 F (37 C), temperature source Oral, resp. rate 18, height 5\' 6"  (1.676 m), weight 64 kg, SpO2 100 %, unknown if currently breastfeeding.  Physical Exam:  General: alert and cooperative Lochia: appropriate Uterine Fundus: firm DVT Evaluation: No evidence of DVT seen on physical exam.  Recent Labs    11/12/19 2203  HGB 10.7*  HCT 33.9*    Assessment/Plan: Plan for discharge tomorrow and Circumcision prior to discharge   LOS: 1 day   2204 CNM 11/13/2019, 11:17 AM

## 2019-11-14 MED ORDER — IBUPROFEN 600 MG PO TABS: 600.0000 mg | ORAL_TABLET | Freq: Four times a day (QID) | ORAL | 0 refills | Status: DC

## 2019-11-14 NOTE — Progress Notes (Addendum)
POSTPARTUM PROGRESS NOTE  Subjective: Shannon Hernandez is a 29 y.o. V7K8206 currently PPD#2 s/p SVD (2200) at [redacted]w[redacted]d  She reports she doing well. No acute events overnight. She denies any problems with ambulating, voiding or po intake. Denies nausea or vomiting. She has passed flatus. Pain is well controlled.  Lochia is decreasing and without large clots.  Objective: Blood pressure (!) 93/57, pulse (!) 55, temperature 98.3 F (36.8 C), temperature source Oral, resp. rate 17, height _0  (1.676 m), weight 64 kg, SpO2 100 %, unknown if currently breastfeeding.  Physical Exam:  General: alert, cooperative and no distress Chest: no respiratory distress Abdomen: soft, non-tender  Uterine Fundus: firm and at level of umbilicus Extremities: No calf swelling or tenderness  no edema  Recent Labs    11/12/19 2203  HGB 10.7*  HCT 33.9*    Assessment/Plan: Shannon Hernandez is a 29y.o. GO1V6153 currently PPD#2 s/p SVD (2200) at 345w2d  Routine Postpartum Care: Doing well, pain well-controlled.  -- Continue routine care, lactation support  -- Contraception: declines -- Feeding: breast  Dispo: Plan for discharge today.   CaEzequiel EssexMD 11/14/2019 7:04 AM I personally saw and evaluated the patient, performing the key elements of the service. I developed and verified the management plan that is described in the resident's/student's note, and I agree with the content with my edits above. VSS, HRR&R, Resp unlabored, Legs neg.  FrNigel BertholdCNM 11/14/2019 7:56 AM

## 2019-12-20 ENCOUNTER — Other Ambulatory Visit: Payer: Self-pay | Admitting: Advanced Practice Midwife

## 2020-01-05 ENCOUNTER — Other Ambulatory Visit: Payer: Self-pay | Admitting: Advanced Practice Midwife

## 2021-06-15 ENCOUNTER — Other Ambulatory Visit (HOSPITAL_COMMUNITY)
Admission: RE | Admit: 2021-06-15 | Discharge: 2021-06-15 | Disposition: A | Discharge status: Home / Self Care | Payer: Medicaid Other | Source: Ambulatory Visit | Attending: Nurse Practitioner | Admitting: Nurse Practitioner

## 2021-06-15 ENCOUNTER — Encounter: Payer: Self-pay | Admitting: Nurse Practitioner

## 2021-06-15 ENCOUNTER — Ambulatory Visit: Payer: Medicaid Other | Attending: Nurse Practitioner | Admitting: Nurse Practitioner

## 2021-06-15 VITALS — BP 93/66 | HR 82 | Ht 66.0 in | Wt 123.0 lb

## 2021-06-15 DIAGNOSIS — N760 Acute vaginitis: Secondary | ICD-10-CM | POA: Diagnosis present

## 2021-06-15 DIAGNOSIS — D649 Anemia, unspecified: Secondary | ICD-10-CM

## 2021-06-15 DIAGNOSIS — R7309 Other abnormal glucose: Secondary | ICD-10-CM | POA: Diagnosis not present

## 2021-06-15 DIAGNOSIS — Z30011 Encounter for initial prescription of contraceptive pills: Secondary | ICD-10-CM | POA: Diagnosis not present

## 2021-06-15 DIAGNOSIS — Z114 Encounter for screening for human immunodeficiency virus [HIV]: Secondary | ICD-10-CM

## 2021-06-15 MED ORDER — ORTHO-NOVUM 1/35 (28) 1-35 MG-MCG PO TABS: 1.0000 | ORAL_TABLET | Freq: Every day | ORAL | 11 refills | Status: DC

## 2021-06-15 MED ORDER — FLUCONAZOLE 150 MG PO TABS: 150.0000 mg | ORAL_TABLET | Freq: Once | ORAL | 0 refills | Status: AC

## 2021-06-15 NOTE — Progress Notes (Signed)
Assessment & Plan:  Shannon Hernandez was seen today for std testing.  Diagnoses and all orders for this visit:  Initiation of oral contraception -     norethindrone-ethinyl estradiol 1/35 ([place]son 1/35, 28,) tablet; Take 1 tablet by mouth daily.  Acute vaginitis -     Cervicovaginal ancillary only -     fluconazole (DIFLUCAN) 150 MG tablet; Take 1 tablet (150 mg total) by mouth once for 1 dose.  Elevated glucose -     CMP14+EGFR -     Hemoglobin A1c  Anemia, unspecified type -     CBC with Differential  Encounter for screening for HIV -     HIV antibody (with reflex)    Patient has been counseled on age-appropriate routine health concerns for screening and prevention. These are reviewed and up-to-date. Referrals have been placed accordingly. Immunizations are up-to-date or declined.    Subjective:   Chief Complaint  Patient presents with   std testing   [name] 31 y.o. female presents to office today for initiation of oral contraceptives.  She is doing well today but does report some vaginal itching and irritation.  I had prescribed her Ortho-Novum in the past that she would like to restart this today.  Last menstrual period 05/26/2021.  Review of Systems  Constitutional:  Negative for fever, malaise/fatigue and weight loss.  HENT: Negative.  Negative for nosebleeds.   Eyes: Negative.  Negative for blurred vision, double vision and photophobia.  Respiratory: Negative.  Negative for cough and shortness of breath.   Cardiovascular: Negative.  Negative for chest pain, palpitations and leg swelling.  Gastrointestinal: Negative.  Negative for heartburn, nausea and vomiting.  Genitourinary:        Vaginal itching and irritation  Musculoskeletal: Negative.  Negative for myalgias.  Neurological: Negative.  Negative for dizziness, focal weakness, seizures and headaches.  Psychiatric/Behavioral: Negative.  Negative for suicidal ideas.    Past Medical History:  Diagnosis  Date   Abnormal Pap smear    ASCUS   Anemia    Choroid plexus cysts, fetal, affecting care of mother, antepartum    Vaginal Pap smear, abnormal     Past Surgical History:  Procedure Laterality Date   NO PAST SURGERIES      Family History  Problem Relation Age of Onset   Asthma Brother    Diabetes Paternal Grandfather     Social History Reviewed with no changes to be made today.   Outpatient Medications Prior to Visit  Medication Sig Dispense Refill   ibuprofen (ADVIL) 600 MG tablet TAKE 1 TABLET BY MOUTH EVERY 6 HOURS (Patient not taking: Reported on 06/15/2021) 30 tablet 0   No facility-administered medications prior to visit.    No Known Allergies     Objective:    BP 93/66   Pulse 82   Ht '5\' 6"'  (1.676 m)   Wt 123 lb (55.8 kg)   LMP 05/26/2021   SpO2 97%   BMI 19.85 kg/m  Wt Readings from Last 3 Encounters:  06/15/21 123 lb (55.8 kg)  11/12/19 141 lb (64 kg)  05/03/17 103 lb 6 oz (46.9 kg)    Physical Exam Vitals and nursing note reviewed.  Constitutional:      Appearance: She is well-developed.  HENT:     Head: Normocephalic and atraumatic.  Cardiovascular:     Rate and Rhythm: Normal rate and regular rhythm.     Heart sounds: Normal heart sounds. No murmur heard.   No  friction rub. No gallop.  Pulmonary:     Effort: Pulmonary effort is normal. No tachypnea or respiratory distress.     Breath sounds: Normal breath sounds. No decreased breath sounds, wheezing, rhonchi or rales.  Chest:     Chest wall: No tenderness.  Abdominal:     General: Bowel sounds are normal.     Palpations: Abdomen is soft.  Musculoskeletal:        General: Normal range of motion.     Cervical back: Normal range of motion.  Skin:    General: Skin is warm and dry.  Neurological:     Mental Status: She is alert and oriented to person, place, and time.     Coordination: Coordination normal.  Psychiatric:        Behavior: Behavior normal. Behavior is cooperative.         Thought Content: Thought content normal.        Judgment: Judgment normal.         Patient has been counseled extensively about nutrition and exercise as well as the importance of adherence with medications and regular follow-up. The patient was given clear instructions to go to ER or return to medical center if symptoms don't improve, worsen or new problems develop. The patient verbalized understanding.   Follow-up: Return in 6 months (on 12/15/2021), or if symptoms worsen or fail to improve.   Gildardo Pounds, FNP-BC [facility] [place], [place]   06/15/2021, 1:18 PM

## 2021-06-15 NOTE — Progress Notes (Signed)
Check for anemia. Discuss birth control

## 2021-06-16 LAB — CERVICOVAGINAL ANCILLARY ONLY
Bacterial Vaginitis (gardnerella): NEGATIVE
Candida Glabrata: NEGATIVE
Candida Vaginitis: POSITIVE — AB
Chlamydia: NEGATIVE
Comment: NEGATIVE
Comment: NEGATIVE
Comment: NEGATIVE
Comment: NEGATIVE
Comment: NEGATIVE
Comment: NORMAL
Neisseria Gonorrhea: NEGATIVE
Trichomonas: NEGATIVE

## 2021-06-16 LAB — HEMOGLOBIN A1C
Est. average glucose Bld gHb Est-mCnc: 105 mg/dL
Hgb A1c MFr Bld: 5.3 % (ref 4.8–5.6)

## 2021-06-16 LAB — HIV ANTIBODY (ROUTINE TESTING W REFLEX): HIV Screen 4th Generation wRfx: NONREACTIVE

## 2021-06-16 LAB — CBC WITH DIFFERENTIAL/PLATELET
Basophils Absolute: 0.1 10*3/uL (ref 0.0–0.2)
Basos: 1 %
EOS (ABSOLUTE): 0.7 10*3/uL — ABNORMAL HIGH (ref 0.0–0.4)
Eos: 11 %
Hematocrit: 38.7 % (ref 34.0–46.6)
Hemoglobin: 13.6 g/dL (ref 11.1–15.9)
Immature Grans (Abs): 0 10*3/uL (ref 0.0–0.1)
Immature Granulocytes: 0 %
Lymphocytes Absolute: 2 10*3/uL (ref 0.7–3.1)
Lymphs: 32 %
MCH: 30.8 pg (ref 26.6–33.0)
MCHC: 35.1 g/dL (ref 31.5–35.7)
MCV: 88 fL (ref 79–97)
Monocytes Absolute: 0.3 10*3/uL (ref 0.1–0.9)
Monocytes: 4 %
Neutrophils Absolute: 3.3 10*3/uL (ref 1.4–7.0)
Neutrophils: 52 %
Platelets: 222 10*3/uL (ref 150–450)
RBC: 4.42 x10E6/uL (ref 3.77–5.28)
RDW: 12.2 % (ref 11.7–15.4)
WBC: 6.3 10*3/uL (ref 3.4–10.8)

## 2021-06-16 LAB — CMP14+EGFR
ALT: 13 IU/L (ref 0–32)
AST: 13 IU/L (ref 0–40)
Albumin/Globulin Ratio: 1.6 (ref 1.2–2.2)
Albumin: 4.7 g/dL (ref 3.8–4.8)
Alkaline Phosphatase: 46 IU/L (ref 44–121)
BUN/Creatinine Ratio: 10 (ref 9–23)
BUN: 8 mg/dL (ref 6–20)
Bilirubin Total: 0.3 mg/dL (ref 0.0–1.2)
CO2: 21 mmol/L (ref 20–29)
Calcium: 9.4 mg/dL (ref 8.7–10.2)
Chloride: 105 mmol/L (ref 96–106)
Creatinine, Ser: 0.77 mg/dL (ref 0.57–1.00)
Globulin, Total: 2.9 g/dL (ref 1.5–4.5)
Glucose: 97 mg/dL (ref 70–99)
Potassium: 4.1 mmol/L (ref 3.5–5.2)
Sodium: 140 mmol/L (ref 134–144)
Total Protein: 7.6 g/dL (ref 6.0–8.5)
eGFR: 104 mL/min/{1.73_m2} (ref >59)

## 2021-06-21 ENCOUNTER — Other Ambulatory Visit: Payer: Self-pay | Admitting: Nurse Practitioner

## 2021-06-21 DIAGNOSIS — B3731 Acute candidiasis of vulva and vagina: Secondary | ICD-10-CM

## 2021-06-21 MED ORDER — FLUCONAZOLE 150 MG PO TABS: 150.0000 mg | ORAL_TABLET | Freq: Once | ORAL | 0 refills | Status: DC

## 2021-07-14 ENCOUNTER — Ambulatory Visit (HOSPITAL_COMMUNITY)
Admission: EM | Admit: 2021-07-14 | Discharge: 2021-07-14 | Disposition: A | Discharge status: Home / Self Care | Payer: Medicaid Other | Attending: Physician Assistant | Admitting: Physician Assistant

## 2021-07-14 ENCOUNTER — Encounter (HOSPITAL_COMMUNITY): Payer: Self-pay | Admitting: *Deleted

## 2021-07-14 DIAGNOSIS — L237 Allergic contact dermatitis due to plants, except food: Secondary | ICD-10-CM

## 2021-07-14 MED ORDER — PREDNISONE 10 MG PO TABS: ORAL_TABLET | ORAL | 0 refills | Status: DC

## 2021-07-14 NOTE — Discharge Instructions (Addendum)
Return if any problems.

## 2021-07-14 NOTE — ED Provider Notes (Signed)
[facility]    CSN: [id number] Arrival date & time: 07/14/21  1518      History   Chief Complaint Chief Complaint  Patient presents with   Allergic Reaction    [name] is a 31 y.o. female.   Patient complains of a rash on her face arms legs and abdomen.  She developed the rash after cleaning up some trees and removing vines from trees.  Patient confirmed that vines look like poison ivy when I showed her a picture.  Patient complains of itching swelling   Allergic Reaction   Past Medical History:  Diagnosis Date   Abnormal Pap smear    ASCUS   Anemia    Choroid plexus cysts, fetal, affecting care of mother, antepartum    Vaginal Pap smear, abnormal     Patient Active Problem List   Diagnosis Date Noted   Grand multipara in labor 11/12/2019   SVD (spontaneous vaginal delivery) 11/04/2017   Seasonal allergies 12/13/2011   Underweight 12/13/2011    Past Surgical History:  Procedure Laterality Date   NO PAST SURGERIES      OB History     Gravida  7   Para  7   Term  7   Preterm      AB      Living  7      SAB      IAB      Ectopic      Multiple  0   Live Births  7            Home Medications    Prior to Admission medications   Medication Sig Start Date End Date Taking? Authorizing Provider  norethindrone-ethinyl estradiol 1/35 (ORTHO-NOVUM 1/35, 28,) tablet Take 1 tablet by mouth daily. 06/15/21  Yes Claiborne Rigg, NP  predniSONE (DELTASONE) 10 MG tablet Taper 6,6,5,5,4,4,3,3,2,2,1,1 taper 07/14/21  Yes Cheron Schaumann K, PA-C  ibuprofen (ADVIL) 600 MG tablet TAKE 1 TABLET BY MOUTH EVERY 6 HOURS Patient not taking: Reported on 06/15/2021 12/25/19   Jacklyn Shell, CNM    Family History Family History  Problem Relation Age of Onset   Asthma Brother    Diabetes Paternal Grandfather     Social History Social History   Tobacco Use   Smoking status: Never   Smokeless tobacco: Never  Vaping Use    Vaping Use: Never used  Substance Use Topics   Alcohol use: No   Drug use: No     Allergies   Patient has no known allergies.   Review of Systems Review of Systems  All other systems reviewed and are negative.    Physical Exam Triage Vital Signs ED Triage Vitals  Enc Vitals Group     BP 07/14/21 1605 110/71     Pulse Rate 07/14/21 1605 65     Resp 07/14/21 1605 16     Temp 07/14/21 1605 98.3 F (36.8 C)     Temp Source 07/14/21 1605 Oral     SpO2 07/14/21 1605 97 %     Weight --      Height --      Head Circumference --      Peak Flow --      Pain Score 07/14/21 1607 0     Pain Loc --      Pain Edu? --      Excl. in GC? --    No data found.  Updated Vital Signs BP 110/71  Pulse 65   Temp 98.3 F (36.8 C) (Oral)   Resp 16   LMP 07/11/2021 (Exact Date)   SpO2 97%   Breastfeeding No   Visual Acuity Right Eye Distance:   Left Eye Distance:   Bilateral Distance:    Right Eye Near:   Left Eye Near:    Bilateral Near:     Physical Exam Vitals and nursing note reviewed.  Constitutional:      Appearance: She is well-developed.  HENT:     Head: Normocephalic.  Cardiovascular:     Rate and Rhythm: Normal rate.  Pulmonary:     Effort: Pulmonary effort is normal.  Abdominal:     General: There is no distension.  Musculoskeletal:        General: Normal range of motion.     Cervical back: Normal range of motion.  Skin:    Findings: Rash present.     Comments: Multiple linear raised blisters arms legs and abdomen  Neurological:     Mental Status: She is alert and oriented to person, place, and time.  Psychiatric:        Mood and Affect: Mood normal.      UC Treatments / Results  Labs (all labs ordered are listed, but only abnormal results are displayed) Labs Reviewed - No data to display  EKG   Radiology No results found.  Procedures Procedures (including critical care time)  Medications Ordered in UC Medications - No data to  display  Initial Impression / Assessment and Plan / UC Course  I have reviewed the triage vital signs and the nursing notes.  Pertinent labs & imaging results that were available during my care of the patient were reviewed by me and considered in my medical decision making (see chart for details).     Patient counseled on avoiding poison ivy she is given a prescription for a 12-day Dosepak she is advised to take Benadryl for itching Final Clinical Impressions(s) / UC Diagnoses   Final diagnoses:  Poison ivy dermatitis     Discharge Instructions      Return if any problems   ED Prescriptions     Medication Sig Dispense Auth. Provider   predniSONE (DELTASONE) 10 MG tablet Taper 6,6,5,5,4,4,3,3,2,2,1,1 taper 42 tablet Elson Areas, [place]      PDMP not reviewed this encounter. An After Visit Summary was printed and given to the patient.    Elson Areas, [place] 07/14/21 1845

## 2021-07-14 NOTE — ED Triage Notes (Addendum)
Pt describes urticaria to BUE, torso, face onset 5 days ago. States gradually spreading more. Has been applying hydrocortisone without relief. Denies any oral or throat swelling, pruritis, or unusual sensations.

## 2021-09-13 ENCOUNTER — Other Ambulatory Visit: Payer: Self-pay | Admitting: Nurse Practitioner

## 2021-09-13 DIAGNOSIS — B3731 Acute candidiasis of vulva and vagina: Secondary | ICD-10-CM

## 2021-09-14 NOTE — Telephone Encounter (Signed)
Requested medication (s) are due for refill today: under review  Requested medication (s) are on the active medication list: no  Last refill:  06/21/21  Future visit scheduled: no  Notes to clinic: Unable to refill per protocol, Rx expired. Medication is not on current list, routing for review.      Requested Prescriptions  Pending Prescriptions Disp Refills   fluconazole (DIFLUCAN) 150 MG tablet [Pharmacy Med Name: FLUCONAZOLE 150 MG TABLET] 1 tablet 0    Sig: Take 1 tablet (150 mg total) by mouth once for 1 dose.     Off-Protocol Failed - 09/13/2021  4:55 PM      Failed - Medication not assigned to a protocol, review manually.      Passed - Valid encounter within last 12 months    Recent Outpatient Visits           3 months ago Initiation of oral contraception   [facility] [place], Shea Stakes, NP   3 years ago Oral contraception initial prescription   [facility] [place], Shea Stakes, NP   4 years ago Dizziness   [facility] [place], Shea Stakes, NP

## 2022-02-27 ENCOUNTER — Ambulatory Visit (HOSPITAL_COMMUNITY)
Admission: EM | Admit: 2022-02-27 | Discharge: 2022-02-27 | Disposition: A | Discharge status: Home / Self Care | Payer: Medicaid Other | Attending: Internal Medicine | Admitting: Internal Medicine

## 2022-02-27 ENCOUNTER — Encounter (HOSPITAL_COMMUNITY): Payer: Self-pay

## 2022-02-27 DIAGNOSIS — J9801 Acute bronchospasm: Secondary | ICD-10-CM

## 2022-02-27 MED ORDER — PREDNISONE 20 MG PO TABS: 40.0000 mg | ORAL_TABLET | Freq: Every day | ORAL | 0 refills | Status: AC

## 2022-02-27 MED ORDER — ALBUTEROL SULFATE HFA 108 (90 BASE) MCG/ACT IN AERS: 1.0000 | INHALATION_SPRAY | Freq: Four times a day (QID) | RESPIRATORY_TRACT | 0 refills | Status: DC | PRN

## 2022-02-27 NOTE — ED Provider Notes (Signed)
[facility]    CSN: [id number] Arrival date & time: 02/27/22  0807      History   Chief Complaint Chief Complaint  Patient presents with   Cough    [name] is a 32 y.o. female comes to the urgent care with couple of days history of shortness of breath, scratchy throat and nonproductive cough as well as chest tightness.  Patient's symptoms started after she finished sanding her cabinets.  Patient was not wearing any facemask when she was sanding at cabinets.  No history of asthma.  No fever or chills.  Cough is nonproductive.  She has chest tightness but denies any chest pain or chest pressure.  No dizziness, near syncope or syncopal episode.  No sick contacts at home.Marland Kitchen   HPI  Past Medical History:  Diagnosis Date   Abnormal Pap smear    ASCUS   Anemia    Choroid plexus cysts, fetal, affecting care of mother, antepartum    Vaginal Pap smear, abnormal     Patient Active Problem List   Diagnosis Date Noted   Grand multipara in labor 11/12/2019   SVD (spontaneous vaginal delivery) 11/04/2017   Seasonal allergies 12/13/2011   Underweight 12/13/2011    Past Surgical History:  Procedure Laterality Date   NO PAST SURGERIES      OB History     Gravida  7   Para  7   Term  7   Preterm      AB      Living  7      SAB      IAB      Ectopic      Multiple  0   Live Births  7            Home Medications    Prior to Admission medications   Medication Sig Start Date End Date Taking? Authorizing Provider  albuterol (VENTOLIN HFA) 108 (90 Base) MCG/ACT inhaler Inhale 1-2 puffs into the lungs every 6 (six) hours as needed for wheezing or shortness of breath. 02/27/22  Yes Trajon Rosete, Myrene Galas, MD  predniSONE (DELTASONE) 20 MG tablet Take 2 tablets (40 mg total) by mouth daily for 5 days. 02/27/22 03/04/22 Yes LampteyMyrene Galas, MD  norethindrone-ethinyl estradiol 1/35 ([place] 1/35, 28,) tablet Take 1 tablet by mouth daily. 06/15/21    Gildardo Pounds, NP    Family History Family History  Problem Relation Age of Onset   Asthma Brother    Diabetes Paternal Grandfather     Social History Social History   Tobacco Use   Smoking status: Never   Smokeless tobacco: Never  Vaping Use   Vaping Use: Never used  Substance Use Topics   Alcohol use: No   Drug use: No     Allergies   Patient has no known allergies.   Review of Systems Review of Systems As per HPI  Physical Exam Triage Vital Signs ED Triage Vitals  Enc Vitals Group     BP 02/27/22 0840 118/79     Pulse Rate 02/27/22 0840 89     Resp 02/27/22 0840 16     Temp 02/27/22 0840 98.2 F (36.8 C)     Temp Source 02/27/22 0840 Oral     SpO2 02/27/22 0840 96 %     Weight --      Height --      Head Circumference --      Peak Flow --  Pain Score 02/27/22 0842 0     Pain Loc --      Pain Edu? --      Excl. in [place]? --    No data found.  Updated Vital Signs BP 118/79 (BP Location: Right Arm)   Pulse 89   Temp 98.2 F (36.8 C) (Oral)   Resp 16   LMP 02/20/2022 (Approximate)   SpO2 96%   Visual Acuity Right Eye Distance:   Left Eye Distance:   Bilateral Distance:    Right Eye Near:   Left Eye Near:    Bilateral Near:     Physical Exam Vitals and nursing note reviewed.  Constitutional:      General: She is not in acute distress.    Appearance: She is not ill-appearing.  HENT:     Right Ear: Tympanic membrane normal.     Left Ear: Tympanic membrane normal.  Cardiovascular:     Rate and Rhythm: Normal rate and regular rhythm.     Pulses: Normal pulses.     Heart sounds: Normal heart sounds.  Pulmonary:     Effort: Pulmonary effort is normal.     Breath sounds: Normal breath sounds.  Abdominal:     General: Bowel sounds are normal.     Palpations: Abdomen is soft.  Neurological:     Mental Status: She is alert.      UC Treatments / Results  Labs (all labs ordered are listed, but only abnormal results are  displayed) Labs Reviewed - No data to display  EKG   Radiology No results found.  Procedures Procedures (including critical care time)  Medications Ordered in UC Medications - No data to display  Initial Impression / Assessment and Plan / UC Course  I have reviewed the triage vital signs and the nursing notes.  Pertinent labs & imaging results that were available during my care of the patient were reviewed by me and considered in my medical decision making (see chart for details).     1.  Acute bronchospasm secondary to dust exposure: Albuterol inhaler as needed for chest tightness/wheezing Prednisone 40 mg orally daily for 5 days Patient is advised to wear a mask when working environment where fine particles can be generated Return precautions given Patient has adequate entry in the lungs bilaterally.  No chest x-ray indicated at this time. Final Clinical Impressions(s) / UC Diagnoses   Final diagnoses:  Acute bronchospasm     Discharge Instructions      Please wear a well-fitting mask when working the light environment where you may be exposed to dust or other toxic inhalants Please take medications as prescribed If you have worsening symptoms please return to urgent care to be reevaluated.    ED Prescriptions     Medication Sig Dispense Auth. Provider   albuterol (VENTOLIN HFA) 108 (90 Base) MCG/ACT inhaler Inhale 1-2 puffs into the lungs every 6 (six) hours as needed for wheezing or shortness of breath. 6.7 g Kaylynn Chamblin, Myrene Galas, MD   predniSONE (DELTASONE) 20 MG tablet Take 2 tablets (40 mg total) by mouth daily for 5 days. 10 tablet Kiley Torrence, Myrene Galas, MD      PDMP not reviewed this encounter.   Chase Picket, MD 02/27/22 1340

## 2022-02-27 NOTE — Discharge Instructions (Addendum)
Please wear a well-fitting mask when working the light environment where you may be exposed to dust or other toxic inhalants Please take medications as prescribed If you have worsening symptoms please return to urgent care to be reevaluated.

## 2022-02-27 NOTE — ED Triage Notes (Signed)
Patient states she has been sanding her cabinets and did not wear a mask. Patient states she has a scratchy throt and and has anon productive cough.  Patient reports that she has not taken any meds or done any treatments.

## 2022-03-02 ENCOUNTER — Other Ambulatory Visit: Payer: Self-pay | Admitting: Nurse Practitioner

## 2022-03-02 DIAGNOSIS — Z30011 Encounter for initial prescription of contraceptive pills: Secondary | ICD-10-CM

## 2022-03-03 NOTE — Telephone Encounter (Signed)
Unable to refill per protocol, appointment needed.  Will refuse duplicate request.  Requested Prescriptions  Pending Prescriptions Disp Refills   ALAYCEN 1/35 tablet [Pharmacy Med Name: ALYACEN 1-35 28 TABLET] 84 tablet 3    Sig: TAKE 1 TABLET BY MOUTH EVERY DAY     OB/GYN:  Contraceptives Passed - 03/02/2022 10:34 PM      Passed - Last BP in normal range    BP Readings from Last 1 Encounters:  02/27/22 118/79         Passed - Valid encounter within last 12 months    Recent Outpatient Visits           8 months ago Initiation of oral contraception   [place], Vernia Buff, NP   3 years ago Oral contraception initial prescription   [place], Vernia Buff, NP   5 years ago [place], [place]              Passed - Patient is not a smoker

## 2022-04-24 ENCOUNTER — Other Ambulatory Visit: Payer: Self-pay | Admitting: Nurse Practitioner

## 2022-04-24 DIAGNOSIS — Z30011 Encounter for initial prescription of contraceptive pills: Secondary | ICD-10-CM

## 2022-04-25 NOTE — Telephone Encounter (Signed)
Patient will need an office visit for further refills. Requested Prescriptions  Pending Prescriptions Disp Refills   NYLIA 1/35 tablet [Pharmacy Med Name: NYLIA 1-35 28 TABLET] 84 tablet 1    Sig: TAKE 1 TABLET BY MOUTH EVERY DAY     OB/GYN:  Contraceptives Passed - 04/24/2022 10:34 PM      Passed - Last BP in normal range    BP Readings from Last 1 Encounters:  02/27/22 118/79         Passed - Valid encounter within last 12 months    Recent Outpatient Visits           10 months ago Initiation of oral contraception   [place] [facility] [place], Shea Stakes, NP   3 years ago Oral contraception initial prescription   [facility] [place], Shea Stakes, NP   5 years ago Dizziness   [facility] [place], Shea Stakes, [place]              Passed - Patient is not a smoker

## 2022-07-07 ENCOUNTER — Encounter (HOSPITAL_COMMUNITY): Payer: Self-pay

## 2022-07-07 ENCOUNTER — Ambulatory Visit (HOSPITAL_COMMUNITY)
Admission: EM | Admit: 2022-07-07 | Discharge: 2022-07-07 | Disposition: A | Discharge status: Home / Self Care | Payer: Medicaid Other | Attending: Family Medicine | Admitting: Family Medicine

## 2022-07-07 ENCOUNTER — Ambulatory Visit (INDEPENDENT_AMBULATORY_CARE_PROVIDER_SITE_OTHER): Payer: Medicaid Other

## 2022-07-07 DIAGNOSIS — S92354A Nondisplaced fracture of fifth metatarsal bone, right foot, initial encounter for closed fracture: Secondary | ICD-10-CM

## 2022-07-07 MED ORDER — KETOROLAC TROMETHAMINE 10 MG PO TABS: 10.0000 mg | ORAL_TABLET | Freq: Four times a day (QID) | ORAL | 0 refills | Status: DC | PRN

## 2022-07-07 MED ORDER — KETOROLAC TROMETHAMINE 30 MG/ML IJ SOLN
INTRAMUSCULAR | Status: AC
  Filled 2022-07-07: qty 1

## 2022-07-07 MED ORDER — KETOROLAC TROMETHAMINE 30 MG/ML IJ SOLN
30.0000 mg | Freq: Once | INTRAMUSCULAR | Status: AC
  Administered 2022-07-07: 30 mg via INTRAMUSCULAR

## 2022-07-07 NOTE — ED Triage Notes (Signed)
Patient here today with c/o right foot pain from an injury yesterday. She has significant swelling. Increased pain with weightbearing. She has not taken anything for the pain. Patient was running around with her kids and tripped and fell on an uneven spot in the floor.

## 2022-07-07 NOTE — ED Provider Notes (Signed)
[facility]    CSN: [id number] Arrival date & time: 07/07/22  1731      History   Chief Complaint Chief Complaint  Patient presents with   Foot Pain    [name] is a 32 y.o. female.    Foot Pain   Here for right foot pain.  Last evening she was running and fell onto her right foot.  She now has pain and swelling over the lateral aspect of the right foot just anterior and inferior to the right ankle.  No known drug allergies  Last menstrual cycle was May 28 Past Medical History:  Diagnosis Date   Abnormal Pap smear    ASCUS   Anemia    Choroid plexus cysts, fetal, affecting care of mother, antepartum    Vaginal Pap smear, abnormal     Patient Active Problem List   Diagnosis Date Noted   Grand multipara in labor 11/12/2019   SVD (spontaneous vaginal delivery) 11/04/2017   Seasonal allergies 12/13/2011   Underweight 12/13/2011    Past Surgical History:  Procedure Laterality Date   NO PAST SURGERIES      OB History     Gravida  7   Para  7   Term  7   Preterm      AB      Living  7      SAB      IAB      Ectopic      Multiple  0   Live Births  7            Home Medications    Prior to Admission medications   Medication Sig Start Date End Date Taking? Authorizing Provider  ketorolac (TORADOL) 10 MG tablet Take 1 tablet (10 mg total) by mouth every 6 (six) hours as needed (pain). 07/07/22  Yes Zenia Resides, MD  norethindrone-ethinyl estradiol 1/35 (NYLIA 1/35) tablet TAKE 1 TABLET BY MOUTH EVERY DAY 04/25/22  Yes Claiborne Rigg, NP  albuterol (VENTOLIN HFA) 108 (90 Base) MCG/ACT inhaler Inhale 1-2 puffs into the lungs every 6 (six) hours as needed for wheezing or shortness of breath. 02/27/22   Lamptey, Britta Mccreedy, MD    Family History Family History  Problem Relation Age of Onset   Asthma Brother    Diabetes Paternal Grandfather     Social History Social History   Tobacco Use   Smoking status:  Never   Smokeless tobacco: Never  Vaping Use   Vaping Use: Never used  Substance Use Topics   Alcohol use: No   Drug use: No     Allergies   Patient has no known allergies.   Review of Systems Review of Systems   Physical Exam Triage Vital Signs ED Triage Vitals  Enc Vitals Group     BP 07/07/22 1819 111/73     Pulse Rate 07/07/22 1819 82     Resp 07/07/22 1819 16     Temp 07/07/22 1819 98.4 F (36.9 C)     Temp Source 07/07/22 1819 Oral     SpO2 07/07/22 1819 99 %     Weight 07/07/22 1819 137 lb (62.1 kg)     Height 07/07/22 1819 5\' 6"  (1.676 m)     Head Circumference --      Peak Flow --      Pain Score 07/07/22 1817 8     Pain Loc --      Pain Edu? --  Excl. in GC? --    No data found.  Updated Vital Signs BP 111/73 (BP Location: Right Arm)   Pulse 82   Temp 98.4 F (36.9 C) (Oral)   Resp 16   Ht 5\' 6"  (1.676 m)   Wt 62.1 kg   LMP 06/13/2022 (Exact Date)   SpO2 99%   BMI 22.11 kg/m   Visual Acuity Right Eye Distance:   Left Eye Distance:   Bilateral Distance:    Right Eye Near:   Left Eye Near:    Bilateral Near:     Physical Exam Vitals reviewed.  Constitutional:      General: She is not in acute distress.    Appearance: She is not ill-appearing, toxic-appearing or diaphoretic.  Musculoskeletal:     Comments: There is tenderness and swelling in an area about 5 x 3 cm on her right lateral foot anterior and inferior to the right lateral malleolus.  Skin:    Coloration: Skin is not pale.  Neurological:     Mental Status: She is alert and oriented to person, place, and time.  Psychiatric:        Behavior: Behavior normal.      UC Treatments / Results  Labs (all labs ordered are listed, but only abnormal results are displayed) Labs Reviewed - No data to display  EKG   Radiology No results found.  Procedures Procedures (including critical care time)  Medications Ordered in UC Medications  ketorolac (TORADOL) 30 MG/ML  injection 30 mg (has no administration in time range)    Initial Impression / Assessment and Plan / UC Course  I have reviewed the triage vital signs and the nursing notes.  Pertinent labs & imaging results that were available during my care of the patient were reviewed by me and considered in my medical decision making (see chart for details).        X-ray shows a fracture of the base of the fifth metatarsal. I discussed options with the patient.  Postop shoe and Ace wrap were applied and she has provided some crutches.  She is given contact information for podiatry  Toradol injection is given here and Toradol pills are sent to the pharmacy. Final Clinical Impressions(s) / UC Diagnoses   Final diagnoses:  Closed nondisplaced fracture of fifth metatarsal bone of right foot, initial encounter     Discharge Instructions      You have a broken bone --it is the base of the fifth metatarsal.    You have been given a shot of Toradol 30 mg today.  Ketorolac 10 mg tablets--take 1 tablet every 6 hours as needed for pain.  This is the same medicine that is in the shot we just gave you  Ice and elevate your foot today and tomorrow.       ED Prescriptions     Medication Sig Dispense Auth. Provider   ketorolac (TORADOL) 10 MG tablet Take 1 tablet (10 mg total) by mouth every 6 (six) hours as needed (pain). 20 tablet Avion Kutzer, Janace Aris, MD      PDMP not reviewed this encounter.   Zenia Resides, MD 07/07/22 [id number]

## 2022-07-07 NOTE — Discharge Instructions (Signed)
You have a broken bone --it is the base of the fifth metatarsal.    You have been given a shot of Toradol 30 mg today.  Ketorolac 10 mg tablets--take 1 tablet every 6 hours as needed for pain.  This is the same medicine that is in the shot we just gave you  Ice and elevate your foot today and tomorrow.

## 2022-07-19 ENCOUNTER — Other Ambulatory Visit: Payer: Self-pay | Admitting: Podiatry

## 2022-07-19 ENCOUNTER — Ambulatory Visit (INDEPENDENT_AMBULATORY_CARE_PROVIDER_SITE_OTHER): Payer: Medicaid Other | Admitting: Podiatry

## 2022-07-19 ENCOUNTER — Ambulatory Visit (INDEPENDENT_AMBULATORY_CARE_PROVIDER_SITE_OTHER): Payer: Medicaid Other

## 2022-07-19 ENCOUNTER — Encounter: Payer: Self-pay | Admitting: Podiatry

## 2022-07-19 DIAGNOSIS — R52 Pain, unspecified: Secondary | ICD-10-CM

## 2022-07-19 DIAGNOSIS — S92351D Displaced fracture of fifth metatarsal bone, right foot, subsequent encounter for fracture with routine healing: Secondary | ICD-10-CM

## 2022-07-19 NOTE — Progress Notes (Signed)
Subjective:   Patient ID: Shannon Hernandez, female   DOB: 32 y.o.   MRN: [id number]   HPI Patient presents stating that she hurt her right foot 2 weeks ago and she cannot bear weight on is on crutches surgical shoe and was given a diagnosis of fracture.  Patient does not smoke likes to be active and has 7 children   Review of Systems  All other systems reviewed and are negative.       Objective:  Physical Exam Vitals and nursing note reviewed.  Constitutional:      Appearance: She is well-developed.  Pulmonary:     Effort: Pulmonary effort is normal.  Musculoskeletal:        General: Normal range of motion.  Skin:    General: Skin is warm.  Neurological:     Mental Status: She is alert.     Neurovascular status intact muscle strength found to be adequate range of motion adequate exquisite discomfort base of fifth metatarsal right fluid buildup around the area with a lot of discomfort noted.  Good digital perfusion well-oriented     Assessment:  Fracture of the base of the fifth metatarsal right secondary to injury 2 weeks ago     Plan:  H&P x-rays reviewed went ahead today applied air fracture walker to immobilize along with aggressive ice and reappoint as symptoms indicate hopefully will not require surgery but will take 8 to 12 weeks to heal  X-rays indicate fracture of the base of the fifth metatarsal right does not appear displaced currently and not a Jones type fracture so should heal with immobilization

## 2022-08-03 ENCOUNTER — Other Ambulatory Visit: Payer: Self-pay | Admitting: Nurse Practitioner

## 2022-08-03 DIAGNOSIS — Z30011 Encounter for initial prescription of contraceptive pills: Secondary | ICD-10-CM

## 2022-08-03 NOTE — Telephone Encounter (Signed)
Medication Refill - Medication: norethindrone-ethinyl estradiol 1/35 (NYLIA 1/35) tablet   Has the patient contacted their pharmacy? No.  She knows that they will telll her no refills  (Preferred Pharmacy (with phone number or street name):  [id number] - [place] - [address] AT [facility] [address] Phone: [phone]  Fax: [phone]    [facility] [place]    Has the patient been seen for an appointment in the last year OR does the patient have an upcoming appointment? Yes.    Agent: Please be advised that RX refills may take up to 3 business days. We ask that you follow-up with your pharmacy.

## 2022-08-04 MED ORDER — NYLIA 1/35 1-35 MG-MCG PO TABS
1.0000 | ORAL_TABLET | Freq: Every day | ORAL | 0 refills | Status: DC
Start: 2022-08-04 — End: 2022-08-11

## 2022-08-04 NOTE — Telephone Encounter (Signed)
Future OV scheduled 10/02/22.  Requested Prescriptions  Pending Prescriptions Disp Refills   norethindrone-ethinyl estradiol 1/35 (NYLIA 1/35) tablet 84 tablet 0    Sig: Take 1 tablet by mouth daily.     OB/GYN:  Contraceptives Failed - 08/03/2022  2:13 PM      Failed - Valid encounter within last 12 months    Recent Outpatient Visits           1 year ago Initiation of oral contraception   [place] [facility] [place], Shea Stakes, NP   4 years ago Oral contraception initial prescription   [facility] [place], Shea Stakes, NP   5 years ago Dizziness   [place] [facility] Claiborne Rigg, NP       Future Appointments             In 1 month Claiborne Rigg, NP [facility]            Passed - Last BP in normal range    BP Readings from Last 1 Encounters:  07/07/22 111/73         Passed - Patient is not a smoker

## 2022-08-10 ENCOUNTER — Telehealth: Payer: Self-pay

## 2022-08-10 NOTE — Telephone Encounter (Signed)
Copied from CRM [id number]. Topic: General - Other >> Aug 10, 2022  1:26 PM Franchot Heidelberg wrote: Reason for CRM: Pt called requesting to have [facility]  [facility] [address], [address] [place] [address] Phone: [phone] Fax: [phone]  norethindrone-ethinyl estradiol 1/35 (NYLIA 1/35) tablet

## 2022-08-11 ENCOUNTER — Other Ambulatory Visit: Payer: Self-pay

## 2022-08-11 DIAGNOSIS — Z30011 Encounter for initial prescription of contraceptive pills: Secondary | ICD-10-CM

## 2022-08-11 MED ORDER — NYLIA 1/35 1-35 MG-MCG PO TABS
1.0000 | ORAL_TABLET | Freq: Every day | ORAL | 0 refills | Status: DC
Start: 2022-08-11 — End: 2022-08-14
  Filled 2022-08-11 – 2022-08-14 (×2): qty 84, 84d supply, fill #0

## 2022-08-11 NOTE — Telephone Encounter (Signed)
RX reordered.

## 2022-08-14 ENCOUNTER — Other Ambulatory Visit: Payer: Self-pay

## 2022-08-14 ENCOUNTER — Other Ambulatory Visit: Payer: Self-pay | Admitting: Nurse Practitioner

## 2022-08-14 DIAGNOSIS — Z30011 Encounter for initial prescription of contraceptive pills: Secondary | ICD-10-CM

## 2022-08-14 NOTE — Telephone Encounter (Signed)
Medication Refill - Medication: norethindrone-ethinyl estradiol 1/35 (NYLIA 1/35) tablet  Pt has an appt in September. Med shows sent 07/26 but pt says its not there  Has the patient contacted their pharmacy? yes (Agent: If no, request that the patient contact the pharmacy for the refill. If patient does not wish to contact the pharmacy document the reason why and proceed with request.) (Agent: If yes, when and what did the pharmacy advise?)contact pcp  Preferred Pharmacy (with phone number or street name):  [facility] Phone: [phone]  Fax: [phone]     Has the patient been seen for an appointment in the last year OR does the patient have an upcoming appointment? yes  Agent: Please be advised that RX refills may take up to 3 business days. We ask that you follow-up with your pharmacy.

## 2022-08-15 ENCOUNTER — Other Ambulatory Visit: Payer: Self-pay

## 2022-08-15 MED ORDER — NYLIA 1/35 1-35 MG-MCG PO TABS
1.0000 | ORAL_TABLET | Freq: Every day | ORAL | 0 refills | Status: DC
Start: 2022-08-15 — End: 2022-10-02
  Filled 2022-08-15: qty 84, 84d supply, fill #0

## 2022-08-15 NOTE — Telephone Encounter (Signed)
Requested Prescriptions  Pending Prescriptions Disp Refills   norethindrone-ethinyl estradiol 1/35 (NYLIA 1/35) tablet 84 tablet 0    Sig: Take 1 tablet by mouth daily.     OB/GYN:  Contraceptives Failed - 08/14/2022  4:02 PM      Failed - Valid encounter within last 12 months    Recent Outpatient Visits           1 year ago Initiation of oral contraception   [facility] [place], Shea Stakes, NP   4 years ago Oral contraception initial prescription   [facility] [place], Shea Stakes, NP   5 years ago Dizziness   [facility] Claiborne Rigg, NP       Future Appointments             In 1 month Claiborne Rigg, NP [facility]            Passed - Last BP in normal range    BP Readings from Last 1 Encounters:  07/07/22 111/73         Passed - Patient is not a smoker

## 2022-08-22 ENCOUNTER — Ambulatory Visit (HOSPITAL_COMMUNITY)
Admission: EM | Admit: 2022-08-22 | Discharge: 2022-08-22 | Disposition: A | Discharge status: Home / Self Care | Payer: Medicaid Other | Attending: Family Medicine | Admitting: Family Medicine

## 2022-08-22 ENCOUNTER — Encounter (HOSPITAL_COMMUNITY): Payer: Self-pay

## 2022-08-22 DIAGNOSIS — L255 Unspecified contact dermatitis due to plants, except food: Secondary | ICD-10-CM

## 2022-08-22 MED ORDER — PREDNISONE 10 MG (48) PO TBPK: ORAL_TABLET | ORAL | 0 refills | Status: DC

## 2022-08-22 NOTE — ED Triage Notes (Signed)
Pt c/o rash to face, lt arm and rt hand x2 days after picking up something beside a tress.

## 2022-08-23 NOTE — ED Provider Notes (Signed)
  [facility]   [id number] 08/22/22 Arrival Time: 1807  ASSESSMENT & PLAN:  1. Rhus dermatitis    No signs of skin infection. Begin: Meds ordered this encounter  Medications   predniSONE (STERAPRED UNI-PAK 48 TAB) 10 MG (48) TBPK tablet    Sig: Take as directed.    Dispense:  48 tablet    Refill:  0   Will follow up with PCP or here if worsening or failing to improve as anticipated. Reviewed expectations re: course of current medical issues. Questions answered. Outlined signs and symptoms indicating need for more acute intervention. Patient verbalized understanding. After Visit Summary given.   SUBJECTIVE:  Shannon Hernandez is a 32 y.o. female who presents with a skin complaint. Pt c/o rash to face, lt arm and rt hand x2 days after picking up something beside a tress. Significant itching. Not tx PTA.   OBJECTIVE: Vitals:   08/22/22 1845  BP: 108/75  Pulse: 74  Resp: 18  Temp: 98.4 F (36.9 C)  TempSrc: Oral  SpO2: 99%    General appearance: alert; no distress HEENT: [place]; AT Extremities: no edema; moves all extremities normally Skin: warm and dry; signs of infection: no; areas of linear papules and vesicles with surrounding erythema over forearms and face Psychological: alert and cooperative; normal mood and affect  Allergies  Allergen Reactions   Tropic-Proparaca-Pe-Ketorolac Itching and Swelling    Past Medical History:  Diagnosis Date   Abnormal Pap smear    ASCUS   Anemia    Choroid plexus cysts, fetal, affecting care of mother, antepartum    Vaginal Pap smear, abnormal    Social History   Socioeconomic History   Marital status: Married    Spouse name: Not on file   Number of children: 5   Years of education: Not on file   Highest education level: Not on file  Occupational History   Not on file  Tobacco Use   Smoking status: Never   Smokeless tobacco: Never  Vaping Use   Vaping status: Never Used  Substance and Sexual Activity    Alcohol use: No   Drug use: No   Sexual activity: Not Currently    Birth control/protection: Pill  Other Topics Concern   Not on file  Social History Narrative   Not on file   Social Determinants of Health   Financial Resource Strain: Low Risk  (11/04/2017)   Overall Financial Resource Strain (CARDIA)    Difficulty of Paying Living Expenses: Not very hard  Food Insecurity: No Food Insecurity (11/04/2017)   Hunger Vital Sign    Worried About Running Out of Food in the Last Year: Never true    Ran Out of Food in the Last Year: Never true  Transportation Needs: No Transportation Needs (11/04/2017)   PRAPARE - Administrator, Civil Service (Medical): No    Lack of Transportation (Non-Medical): No  Physical Activity: Not on file  Stress: Not on file  Social Connections: Not on file  Intimate Partner Violence: Not on file   Family History  Problem Relation Age of Onset   Asthma Brother    Diabetes Paternal Grandfather    Past Surgical History:  Procedure Laterality Date   NO PAST SURGERIES        Mardella Layman, MD 08/23/22 1022

## 2022-09-25 ENCOUNTER — Other Ambulatory Visit: Payer: Self-pay | Admitting: Nurse Practitioner

## 2022-09-25 DIAGNOSIS — Z30011 Encounter for initial prescription of contraceptive pills: Secondary | ICD-10-CM

## 2022-10-02 ENCOUNTER — Other Ambulatory Visit: Payer: Self-pay

## 2022-10-02 ENCOUNTER — Ambulatory Visit: Payer: Medicaid Other | Attending: Nurse Practitioner | Admitting: Nurse Practitioner

## 2022-10-02 ENCOUNTER — Encounter: Payer: Self-pay | Admitting: Nurse Practitioner

## 2022-10-02 VITALS — BP 94/62 | HR 62 | Ht 66.0 in | Wt 139.4 lb

## 2022-10-02 DIAGNOSIS — Z30011 Encounter for initial prescription of contraceptive pills: Secondary | ICD-10-CM | POA: Diagnosis not present

## 2022-10-02 DIAGNOSIS — Z0000 Encounter for general adult medical examination without abnormal findings: Secondary | ICD-10-CM | POA: Diagnosis not present

## 2022-10-02 MED ORDER — NYLIA 1/35 1-35 MG-MCG PO TABS
1.0000 | ORAL_TABLET | Freq: Every day | ORAL | 3 refills | Status: DC
Start: 2022-10-02 — End: 2022-11-03
  Filled 2022-10-02: qty 84, 84d supply, fill #0

## 2022-10-02 NOTE — Progress Notes (Signed)
Assessment & Plan:  Shannon Hernandez was seen today for annual exam.  Diagnoses and all orders for this visit:  Encounter for annual physical exam -     CMP14+EGFR -     CBC with Differential  Initiation of oral contraception -     norethindrone-ethinyl estradiol 1/35 (NYLIA 1/35) tablet; Take 1 tablet by mouth daily.    Patient has been counseled on age-appropriate routine health concerns for screening and prevention. These are reviewed and up-to-date. Referrals have been placed accordingly. Immunizations are up-to-date or declined.    Subjective:   Chief Complaint  Patient presents with   Annual Exam   [name] 32 y.o. female presents to office today for annual physical exam.  She has concerns of short-term memory loss. Leaves things in other rooms or forgets to bring and has to be reminded. She does not forget names of family members, important birth dates or where she lives.          Review of Systems  Constitutional:  Negative for fever, malaise/fatigue and weight loss.  HENT: Negative.  Negative for nosebleeds.   Eyes: Negative.  Negative for blurred vision, double vision and photophobia.  Respiratory: Negative.  Negative for cough and shortness of breath.   Cardiovascular: Negative.  Negative for chest pain, palpitations and leg swelling.  Gastrointestinal: Negative.  Negative for heartburn, nausea and vomiting.  Genitourinary: Negative.   Musculoskeletal: Negative.  Negative for myalgias.  Skin: Negative.   Neurological: Negative.  Negative for dizziness, focal weakness, seizures and headaches.  Endo/Heme/Allergies: Negative.   Psychiatric/Behavioral: Negative.  Negative for suicidal ideas.     Past Medical History:  Diagnosis Date   Abnormal Pap smear    ASCUS   Anemia    Choroid plexus cysts, fetal, affecting care of mother, antepartum    Vaginal Pap smear, abnormal     Past Surgical History:  Procedure Laterality Date   NO PAST SURGERIES       Family History  Problem Relation Age of Onset   Asthma Brother    Diabetes Paternal Grandfather     Social History Reviewed with no changes to be made today.   Outpatient Medications Prior to Visit  Medication Sig Dispense Refill   albuterol (VENTOLIN HFA) 108 (90 Base) MCG/ACT inhaler Inhale 1-2 puffs into the lungs every 6 (six) hours as needed for wheezing or shortness of breath. 6.7 g 0   norethindrone-ethinyl estradiol 1/35 (NYLIA 1/35) tablet Take 1 tablet by mouth daily (Patient will need an office visit for further refills) 84 tablet 0   predniSONE (STERAPRED UNI-PAK 48 TAB) 10 MG (48) TBPK tablet Take as directed. 48 tablet 0   No facility-administered medications prior to visit.    Allergies  Allergen Reactions   Tropic-Proparaca-Pe-Ketorolac Itching and Swelling       Objective:    BP 94/62 (BP Location: Left Arm, Patient Position: Sitting, Cuff Size: Normal)   Pulse 62   Ht 5\' 6"  (1.676 m)   Wt 139 lb 6.4 oz (63.2 kg)   LMP 09/06/2022   SpO2 100%   BMI 22.50 kg/m  Wt Readings from Last 3 Encounters:  10/02/22 139 lb 6.4 oz (63.2 kg)  07/07/22 137 lb (62.1 kg)  06/15/21 123 lb (55.8 kg)    Physical Exam Constitutional:      Appearance: She is well-developed.  HENT:     Head: Normocephalic and atraumatic.     Right Ear: Hearing, tympanic membrane, ear canal and  external ear normal.     Left Ear: Hearing, tympanic membrane, ear canal and external ear normal.     Nose: Nose normal.     Right Turbinates: Not enlarged.     Left Turbinates: Not enlarged.     Mouth/Throat:     Lips: Pink.     Mouth: Mucous membranes are moist.     Dentition: No dental tenderness, gingival swelling, dental abscesses or gum lesions.     Pharynx: No oropharyngeal exudate.  Eyes:     General: No scleral icterus.       Right eye: No discharge.     Extraocular Movements: Extraocular movements intact.     Conjunctiva/sclera: Conjunctivae normal.     Pupils: Pupils are  equal, round, and reactive to light.  Neck:     Thyroid: No thyromegaly.     Trachea: No tracheal deviation.  Cardiovascular:     Rate and Rhythm: Normal rate and regular rhythm.     Heart sounds: Normal heart sounds. No murmur heard.    No friction rub.  Pulmonary:     Effort: Pulmonary effort is normal. No accessory muscle usage or respiratory distress.     Breath sounds: Normal breath sounds. No decreased breath sounds, wheezing, rhonchi or rales.  Abdominal:     General: Bowel sounds are normal. There is no distension.     Palpations: Abdomen is soft. There is no mass.     Tenderness: There is no abdominal tenderness. There is no right CVA tenderness, left CVA tenderness, guarding or rebound.     Hernia: No hernia is present.  Musculoskeletal:        General: No tenderness or deformity. Normal range of motion.     Cervical back: Normal range of motion and neck supple.  Lymphadenopathy:     Cervical: No cervical adenopathy.  Skin:    General: Skin is warm and dry.     Findings: No erythema.  Neurological:     Mental Status: She is alert and oriented to person, place, and time.     Cranial Nerves: No cranial nerve deficit.     Motor: Motor function is intact.     Coordination: Coordination is intact. Coordination normal.     Gait: Gait is intact.     Deep Tendon Reflexes:     Reflex Scores:      Patellar reflexes are 1+ on the right side and 1+ on the left side. Psychiatric:        Attention and Perception: Attention normal.        Mood and Affect: Mood normal.        Speech: Speech normal.        Behavior: Behavior normal.        Thought Content: Thought content normal.        Cognition and Memory: Cognition and memory normal.        Judgment: Judgment normal.          Patient has been counseled extensively about nutrition and exercise as well as the importance of adherence with medications and regular follow-up. The patient was given clear instructions to go to ER  or return to medical center if symptoms don't improve, worsen or new problems develop. The patient verbalized understanding.   Follow-up: No follow-ups on file.   Claiborne Rigg, FNP-BC [facility] [place], [place] [phone]   10/02/2022, 11:29 AM

## 2022-10-03 LAB — CBC WITH DIFFERENTIAL/PLATELET
Basophils Absolute: 0 10*3/uL (ref 0.0–0.2)
Basos: 1 %
EOS (ABSOLUTE): 0.1 10*3/uL (ref 0.0–0.4)
Eos: 3 %
Hematocrit: 38.8 % (ref 34.0–46.6)
Hemoglobin: 12.6 g/dL (ref 11.1–15.9)
Immature Grans (Abs): 0 10*3/uL (ref 0.0–0.1)
Immature Granulocytes: 0 %
Lymphocytes Absolute: 1.9 10*3/uL (ref 0.7–3.1)
Lymphs: 38 %
MCH: 30.4 pg (ref 26.6–33.0)
MCHC: 32.5 g/dL (ref 31.5–35.7)
MCV: 94 fL (ref 79–97)
Monocytes Absolute: 0.3 10*3/uL (ref 0.1–0.9)
Monocytes: 6 %
Neutrophils Absolute: 2.7 10*3/uL (ref 1.4–7.0)
Neutrophils: 52 %
Platelets: 243 10*3/uL (ref 150–450)
RBC: 4.14 x10E6/uL (ref 3.77–5.28)
RDW: 12.3 % (ref 11.7–15.4)
WBC: 5.1 10*3/uL (ref 3.4–10.8)

## 2022-10-03 LAB — CMP14+EGFR
ALT: 7 IU/L (ref 0–32)
AST: 10 IU/L (ref 0–40)
Albumin: 4.3 g/dL (ref 3.9–4.9)
Alkaline Phosphatase: 37 IU/L — ABNORMAL LOW (ref 44–121)
BUN/Creatinine Ratio: 11 (ref 9–23)
BUN: 9 mg/dL (ref 6–20)
Bilirubin Total: <0.2 mg/dL (ref 0.0–1.2)
CO2: 22 mmol/L (ref 20–29)
Calcium: 9.4 mg/dL (ref 8.7–10.2)
Chloride: 103 mmol/L (ref 96–106)
Creatinine, Ser: 0.83 mg/dL (ref 0.57–1.00)
Globulin, Total: 2.6 g/dL (ref 1.5–4.5)
Glucose: 98 mg/dL (ref 70–99)
Potassium: 4.4 mmol/L (ref 3.5–5.2)
Sodium: 139 mmol/L (ref 134–144)
Total Protein: 6.9 g/dL (ref 6.0–8.5)
eGFR: 95 mL/min/{1.73_m2} (ref >59)

## 2022-10-04 ENCOUNTER — Other Ambulatory Visit: Payer: Self-pay

## 2022-11-03 ENCOUNTER — Other Ambulatory Visit: Payer: Self-pay | Admitting: Nurse Practitioner

## 2022-11-03 DIAGNOSIS — Z30011 Encounter for initial prescription of contraceptive pills: Secondary | ICD-10-CM

## 2022-11-03 MED ORDER — NYLIA 1/35 1-35 MG-MCG PO TABS
1.0000 | ORAL_TABLET | Freq: Every day | ORAL | 3 refills | Status: DC
Start: 2022-11-03 — End: 2023-02-17

## 2022-11-03 NOTE — Telephone Encounter (Signed)
Requested Prescriptions  Pending Prescriptions Disp Refills   norethindrone-ethinyl estradiol 1/35 (NYLIA 1/35) tablet 84 tablet 3    Sig: Take 1 tablet by mouth daily.     OB/GYN:  Contraceptives Passed - 11/03/2022  2:42 PM      Passed - Last BP in normal range    BP Readings from Last 1 Encounters:  10/02/22 94/62         Passed - Valid encounter within last 12 months    Recent Outpatient Visits           1 month ago Encounter for annual physical exam   [facility] [place], Shea Stakes, NP   1 year ago Initiation of oral contraception   [facility] [place], Shea Stakes, NP   4 years ago Oral contraception initial prescription   [facility] [place], Shea Stakes, NP   5 years ago Dizziness   [facility] [place], Shea Stakes, NP              Passed - Patient is not a smoker       Pharmacy changed.

## 2022-11-03 NOTE — Telephone Encounter (Addendum)
Medication Refill - Medication: norethindrone-ethinyl estradiol 1/35 (NYLIA 1/35) tablet   Pt stated she lost her birth control and is asking if PCP can send a refill. I advised pt she has refills; she mentioned she has not contacted the pharmacy and would like refills. Please advise.  Has the patient contacted their pharmacy? No. (Agent: If no, request that the patient contact the pharmacy for the refill. If patient does not wish to contact the pharmacy document the reason why and proceed with request.)   Preferred Pharmacy (with phone number or street name):  [facility]  [address] [place] [address]  Phone: [phone] Fax: [phone]  Hours: M-F 7:30a-6:00p   Has the patient been seen for an appointment in the last year OR does the patient have an upcoming appointment? Yes.    Agent: Please be advised that RX refills may take up to 3 business days. We ask that you follow-up with your pharmacy.

## 2023-01-17 NOTE — L&D Delivery Note (Addendum)
 OB/GYN Faculty Practice Delivery Note  Shannon Hernandez is a 32 y.o. [id number] s/p SVD at [redacted]w[redacted]d. She was admitted for spontaneous onset of labor.   ROM: 0h 52m with clear fluid GBS Status: negative Maximum Maternal Temperature: 99.4 F  Labor Progress: Presented to the MAU with SVE of 7/90/-1, transferred to L&D, AROMc, then porgressed quickly to fully/+1  Delivery Date/Time: 09/26/2023 Delivery: Called to room and patient was complete and pushing. Head delivered MOA with restitution to LOA. No nuchal cord present. Shoulder and body delivered in usual fashion. Infant with spontaneous cry, placed on mother's abdomen, dried and stimulated. Cord clamped x 2 after 1-minute delay, and cut by father of baby. Cord blood drawn. TXA and pitocin  started given multiparity and increased risk for postpartum hemorrhage. Traction applied to placenta, however anterior aspect of placenta adherent to uterine fundus. 100mcg fentanyl  given to patient for comfort. Placenta then delivered spontaneously, intact, with 3-vessel cord. Fundus firm with massage and Pitocin . Labia, perineum, vagina, and cervix inspected, no lacerations found.   Placenta: Intact Complications: None Lacerations: None QBL: Analgesia: None  Postpartum Planning [x]  transfer orders to MB [x]  discharge summary started & shared [x]  message to sent to schedule follow-up  [x]  lists updated  Infant: Viable female  APGARs 8, 9  3790g  Charlie DELENA Courts, MD 09/26/2023, 6:28 PM

## 2023-02-01 ENCOUNTER — Telehealth: Payer: Self-pay | Admitting: Nurse Practitioner

## 2023-02-01 NOTE — Telephone Encounter (Signed)
I do not have a reason to check her hormones. Can we find out why she wants her hormones checked prior to scheduling her a 130 virtual visit so I will understand the reason for the visit. Thank you.

## 2023-02-01 NOTE — Telephone Encounter (Signed)
Copied from CRM [id number]. Topic: Appointment Scheduling - Scheduling Inquiry for Clinic >> Feb 01, 2023  2:30 PM Dondra Prader E wrote: Reason for CRM: Pt called requesting to be scheduled for blood work. Has a specific test she wants, unable to understand what she is saying. "HZT" ? Possibly   Best contact: [phone]

## 2023-02-02 ENCOUNTER — Telehealth (HOSPITAL_BASED_OUTPATIENT_CLINIC_OR_DEPARTMENT_OTHER): Payer: Medicaid Other | Admitting: Nurse Practitioner

## 2023-02-02 ENCOUNTER — Encounter: Payer: Self-pay | Admitting: Nurse Practitioner

## 2023-02-02 ENCOUNTER — Ambulatory Visit: Payer: Medicaid Other | Attending: Nurse Practitioner

## 2023-02-02 DIAGNOSIS — N911 Secondary amenorrhea: Secondary | ICD-10-CM

## 2023-02-02 DIAGNOSIS — Z3201 Encounter for pregnancy test, result positive: Secondary | ICD-10-CM | POA: Diagnosis not present

## 2023-02-02 LAB — POCT URINE PREGNANCY: Preg Test, Ur: POSITIVE — AB

## 2023-02-02 NOTE — Telephone Encounter (Signed)
Appointment set

## 2023-02-02 NOTE — Progress Notes (Signed)
Virtual Visit Consent   Shannon Hernandez, you are scheduled for a virtual visit with a [facility] provider today. Just as with appointments in the office, your consent must be obtained to participate. Your consent will be active for this visit and any virtual visit you may have with one of our providers in the next 365 days. If you have a MyChart account, a copy of this consent can be sent to you electronically.  As this is a virtual visit, video technology does not allow for your provider to perform a traditional examination. This may limit your provider's ability to fully assess your condition. If your provider identifies any concerns that need to be evaluated in person or the need to arrange testing (such as labs, EKG, etc.), we will make arrangements to do so. Although advances in technology are sophisticated, we cannot ensure that it will always work on either your end or our end. If the connection with a video visit is poor, the visit may have to be switched to a telephone visit. With either a video or telephone visit, we are not always able to ensure that we have a secure connection.  By engaging in this virtual visit, you consent to the provision of healthcare and authorize for your insurance to be billed (if applicable) for the services provided during this visit. Depending on your insurance coverage, you may receive a charge related to this service.  I need to obtain your verbal consent now. Are you willing to proceed with your visit today? Shannon Hernandez has provided verbal consent on 02/02/2023 for a virtual visit (video or telephone). Claiborne Rigg, NP  Date: 02/02/2023 1:41 PM  Virtual Visit via Video Note   I, Claiborne Rigg, connected with  Shannon Hernandez  ([id number], Oct 12, 1990) on 02/02/23 at  1:30 PM EST by a video-enabled telemedicine application and verified that I am speaking with the correct person using two identifiers.  Location: Patient: Virtual Visit Location Patient:  Mobile Provider: Virtual Visit Location Provider: Home Office   I discussed the limitations of evaluation and management by telemedicine and the availability of in person appointments. The patient expressed understanding and agreed to proceed.    History of Present Illness: Shannon Hernandez is a 33 y.o. who identifies as a female who was assigned female at birth, and is being seen today for POSITIVE home pregnancy test .   Shannon Hernandez states her last menstural period was December 6th. She took a home pregnancy test and it was positive on January 1st. Today she states she needs a letter for [employer] stating she is pregnant in order to continue receiving assistance.   Problems:  Patient Active Problem List   Diagnosis Date Noted   Grand multipara in labor 11/12/2019   SVD (spontaneous vaginal delivery) 11/04/2017   Seasonal allergies 12/13/2011   Underweight 12/13/2011    Allergies:  Allergies  Allergen Reactions   Tropic-Proparaca-Pe-Ketorolac Itching and Swelling   Medications:  Current Outpatient Medications:    norethindrone-ethinyl estradiol 1/35 (NYLIA 1/35) tablet, Take 1 tablet by mouth daily., Disp: 84 tablet, Rfl: 3  Observations/Objective: Patient is well-developed, well-nourished in no acute distress.  Resting comfortably in her parked car Head is normocephalic, atraumatic.  No labored breathing.  Speech is clear and coherent with logical content.  Patient is alert and oriented at baseline.    Assessment and Plan: 1. Pregnancy test positive - POCT urine pregnancy - Beta hCG quant (ref lab)  2. Amenorrhea,  secondary (Primary) - POCT urine pregnancy - Beta hCG quant (ref lab)   Follow Up Instructions: I discussed the assessment and treatment plan with the patient. The patient was provided an opportunity to ask questions and all were answered. The patient agreed with the plan and demonstrated an understanding of the instructions.  A copy of instructions were sent to  the patient via MyChart unless otherwise noted below.    The patient was advised to call back or seek an in-person evaluation if the symptoms worsen or if the condition fails to improve as anticipated.    Claiborne Rigg, NP

## 2023-02-03 LAB — BETA HCG QUANT (REF LAB): hCG Quant: 35537 m[IU]/mL

## 2023-02-15 ENCOUNTER — Encounter: Payer: Self-pay | Admitting: Nurse Practitioner

## 2023-02-17 ENCOUNTER — Inpatient Hospital Stay (HOSPITAL_COMMUNITY)
Admission: AD | Admit: 2023-02-17 | Discharge: 2023-02-17 | Disposition: A | Discharge status: Home / Self Care | Payer: Medicaid Other | Attending: Obstetrics & Gynecology | Admitting: Obstetrics & Gynecology

## 2023-02-17 ENCOUNTER — Inpatient Hospital Stay (HOSPITAL_COMMUNITY): Payer: Medicaid Other

## 2023-02-17 ENCOUNTER — Encounter (HOSPITAL_COMMUNITY): Payer: Self-pay | Admitting: *Deleted

## 2023-02-17 DIAGNOSIS — R1031 Right lower quadrant pain: Secondary | ICD-10-CM | POA: Insufficient documentation

## 2023-02-17 DIAGNOSIS — R109 Unspecified abdominal pain: Secondary | ICD-10-CM

## 2023-02-17 DIAGNOSIS — O200 Threatened abortion: Secondary | ICD-10-CM | POA: Diagnosis not present

## 2023-02-17 DIAGNOSIS — O208 Other hemorrhage in early pregnancy: Secondary | ICD-10-CM | POA: Insufficient documentation

## 2023-02-17 DIAGNOSIS — N939 Abnormal uterine and vaginal bleeding, unspecified: Secondary | ICD-10-CM

## 2023-02-17 DIAGNOSIS — Z3A08 8 weeks gestation of pregnancy: Secondary | ICD-10-CM | POA: Diagnosis not present

## 2023-02-17 DIAGNOSIS — O418X90 Other specified disorders of amniotic fluid and membranes, unspecified trimester, not applicable or unspecified: Secondary | ICD-10-CM | POA: Insufficient documentation

## 2023-02-17 DIAGNOSIS — R42 Dizziness and giddiness: Secondary | ICD-10-CM | POA: Diagnosis present

## 2023-02-17 DIAGNOSIS — Z711 Person with feared health complaint in whom no diagnosis is made: Secondary | ICD-10-CM | POA: Insufficient documentation

## 2023-02-17 DIAGNOSIS — O26851 Spotting complicating pregnancy, first trimester: Secondary | ICD-10-CM | POA: Insufficient documentation

## 2023-02-17 LAB — COMPREHENSIVE METABOLIC PANEL
ALT: 9 U/L (ref 0–44)
AST: 17 U/L (ref 15–41)
Albumin: 3.5 g/dL (ref 3.5–5.0)
Alkaline Phosphatase: 33 U/L — ABNORMAL LOW (ref 38–126)
Anion gap: 10 (ref 5–15)
BUN: 6 mg/dL (ref 6–20)
CO2: 22 mmol/L (ref 22–32)
Calcium: 9 mg/dL (ref 8.9–10.3)
Chloride: 104 mmol/L (ref 98–111)
Creatinine, Ser: 0.77 mg/dL (ref 0.44–1.00)
GFR, Estimated: >60 mL/min (ref >60)
Glucose, Bld: 96 mg/dL (ref 70–99)
Potassium: 4.1 mmol/L (ref 3.5–5.1)
Sodium: 136 mmol/L (ref 135–145)
Total Bilirubin: 0.6 mg/dL (ref 0.0–1.2)
Total Protein: 6.8 g/dL (ref 6.5–8.1)

## 2023-02-17 LAB — CBC
HCT: 35 % — ABNORMAL LOW (ref 36.0–46.0)
Hemoglobin: 12.2 g/dL (ref 12.0–15.0)
MCH: 31 pg (ref 26.0–34.0)
MCHC: 34.9 g/dL (ref 30.0–36.0)
MCV: 89.1 fL (ref 80.0–100.0)
Platelets: 226 10*3/uL (ref 150–400)
RBC: 3.93 MIL/uL (ref 3.87–5.11)
RDW: 12.6 % (ref 11.5–15.5)
WBC: 5.7 10*3/uL (ref 4.0–10.5)
nRBC: 0 % (ref 0.0–0.2)

## 2023-02-17 LAB — URINALYSIS, ROUTINE W REFLEX MICROSCOPIC
Bilirubin Urine: NEGATIVE
Glucose, UA: NEGATIVE mg/dL
Ketones, ur: NEGATIVE mg/dL
Leukocytes,Ua: NEGATIVE
Nitrite: NEGATIVE
Protein, ur: NEGATIVE mg/dL
Specific Gravity, Urine: 1.01 (ref 1.005–1.030)
pH: 7 (ref 5.0–8.0)

## 2023-02-17 LAB — HCG, QUANTITATIVE, PREGNANCY: hCG, Beta Chain, Quant, S: 193366 m[IU]/mL — ABNORMAL HIGH (ref <5)

## 2023-02-17 LAB — WET PREP, GENITAL
Sperm: NONE SEEN
Trich, Wet Prep: NONE SEEN
WBC, Wet Prep HPF POC: >=10 — AB (ref <10)
Yeast Wet Prep HPF POC: NONE SEEN

## 2023-02-17 LAB — ABO/RH: ABO/RH(D): B POS

## 2023-02-17 LAB — URINALYSIS, MICROSCOPIC (REFLEX): Bacteria, UA: NONE SEEN

## 2023-02-17 NOTE — MAU Note (Signed)
Shannon Hernandez is a 33 y.o. at [redacted]w[redacted]d here in MAU reporting: has been experiencing spotting since Monday.  Had been brown, is red this morning., has passed a few quarter sized clots.  Has been having pain in RLQ for a wk, pain is sharp- about the same. Has had some dizziness. Had blood work drawn on 1/27,  did not mention bleeding or pain.  LMP: 12/6 Onset of complaint: wk ago Pain score: 7 Vitals:   02/17/23 0726  BP: 102/65  Pulse: 85  Resp: 16  Temp: 98 F (36.7 C)  SpO2: 100%      Lab orders placed from triage:

## 2023-02-17 NOTE — MAU Provider Note (Signed)
History     CSN: [id number]  Arrival date and time: 02/17/23 8657   Event Date/Time   First Provider Initiated Contact with Patient 02/17/23 [phone]      Chief Complaint  Patient presents with   Vaginal Bleeding   Abdominal Pain   Shannon Hernandez , a  33 y.o. [id number] at [redacted]w[redacted]d presents to MAU with complaints of on-going spotting that started Monday, but turned bright red this morning. Patient states that she noted scant bright red bleeding and passing "stringy clots." She denies saturating a pad. She also reports right sided lower abdominal cramping that has also been on-going since Monday. She denies attempting to relieve symptoms. She denies abnormal vaginal discharge, and urinary symptoms. Last intercourse was 2 weeks ago.           OB History     Gravida  8   Para  7   Term  7   Preterm      AB      Living  7      SAB      IAB      Ectopic      Multiple  0   Live Births  7           Past Medical History:  Diagnosis Date   Abnormal Pap smear    ASCUS   Anemia    Choroid plexus cysts, fetal, affecting care of mother, antepartum    Vaginal Pap smear, abnormal     Past Surgical History:  Procedure Laterality Date   NO PAST SURGERIES      Family History  Problem Relation Age of Onset   Healthy Mother    Heart attack Father    Asthma Brother    Diabetes Paternal Grandfather     Social History   Tobacco Use   Smoking status: Never   Smokeless tobacco: Never  Vaping Use   Vaping status: Never Used  Substance Use Topics   Alcohol use: No   Drug use: No    Allergies:  Allergies  Allergen Reactions   Tropic-Proparaca-Pe-Ketorolac Itching and Swelling    Medications Prior to Admission  Medication Sig Dispense Refill Last Dose/Taking   Prenatal Vit-Fe Fumarate-FA (MULTIVITAMIN-PRENATAL) 27-0.8 MG TABS tablet Take 1 tablet by mouth daily at 12 noon.   02/16/2023   norethindrone-ethinyl estradiol 1/35 (NYLIA 1/35) tablet Take 1 tablet by  mouth daily. 84 tablet 3     Review of Systems  Constitutional:  Negative for chills, fatigue and fever.  Eyes:  Negative for pain and visual disturbance.  Respiratory:  Negative for apnea, shortness of breath and wheezing.   Cardiovascular:  Negative for chest pain and palpitations.  Gastrointestinal:  Positive for abdominal pain. Negative for constipation, diarrhea, nausea and vomiting.  Genitourinary:  Positive for vaginal bleeding. Negative for difficulty urinating, dysuria, pelvic pain, vaginal discharge and vaginal pain.  Musculoskeletal:  Negative for back pain.  Neurological:  Negative for seizures, weakness and headaches.  Psychiatric/Behavioral:  Negative for suicidal ideas.    Physical Exam   Blood pressure 107/61, pulse 75, temperature 98 F (36.7 C), temperature source Oral, resp. rate 16, height 5\' 6"  (1.676 m), weight 60.1 kg, last menstrual period 12/22/2022, SpO2 100%.  Physical Exam Vitals and nursing note reviewed.  Constitutional:      General: She is not in acute distress.    Appearance: Normal appearance.  HENT:     Head: Normocephalic.  Pulmonary:     Effort: Pulmonary  effort is normal.  Musculoskeletal:     Cervical back: Normal range of motion.  Skin:    General: Skin is warm and dry.  Neurological:     Mental Status: She is alert and oriented to person, place, and time.  Psychiatric:        Mood and Affect: Mood normal.     MAU Course  Procedures Orders Placed This Encounter  Procedures   Wet prep, genital   US OB LESS THAN 14 WEEKS WITH OB TRANSVAGINAL   Urinalysis, Routine w reflex microscopic -Urine, Clean Catch   CBC   hCG, quantitative, pregnancy   Comprehensive metabolic panel   Urinalysis, Microscopic (reflex)   Diet NPO time specified   ABO/Rh   Discharge patient   Results for orders placed or performed during the hospital encounter of 02/17/23 (from the past 48 hours)  Urinalysis, Routine w reflex microscopic -Urine, Clean  Catch     Status: Abnormal   Collection Time: 02/17/23  7:43 AM  Result Value Ref Range   Color, Urine YELLOW YELLOW   APPearance CLEAR CLEAR   Specific Gravity, Urine 1.010 1.005 - 1.030   pH 7.0 5.0 - 8.0   Glucose, UA NEGATIVE NEGATIVE mg/dL   Hgb urine dipstick SMALL (A) NEGATIVE   Bilirubin Urine NEGATIVE NEGATIVE   Ketones, ur NEGATIVE NEGATIVE mg/dL   Protein, ur NEGATIVE NEGATIVE mg/dL   Nitrite NEGATIVE NEGATIVE   Leukocytes,Ua NEGATIVE NEGATIVE    Comment: Performed at [facility], [address], [place], [place] [address]  Urinalysis, Microscopic (reflex)     Status: None   Collection Time: 02/17/23  7:43 AM  Result Value Ref Range   RBC / HPF 0-5 0 - 5 RBC/hpf   WBC, UA 0-5 0 - 5 WBC/hpf   Bacteria, UA NONE SEEN NONE SEEN   Squamous Epithelial / HPF 0-5 0 - 5 /HPF   Mucus PRESENT     Comment: Performed at [facility], [address] [place], [place] [address]  Wet prep, genital     Status: Abnormal   Collection Time: 02/17/23  7:47 AM  Result Value Ref Range   Yeast Wet Prep HPF POC NONE SEEN NONE SEEN   Trich, Wet Prep NONE SEEN NONE SEEN   Clue Cells Wet Prep HPF POC PRESENT (A) NONE SEEN   WBC, Wet Prep HPF POC >=10 (A) <10   Sperm NONE SEEN     Comment: Performed at [facility], [address], [place], [place] [address]  ABO/Rh     Status: None   Collection Time: 02/17/23  7:59 AM  Result Value Ref Range   ABO/RH(D) B POS    No rh immune globuloin      NOT A RH IMMUNE GLOBULIN CANDIDATE, PT RH POSITIVE Performed at [facility], [address] [place], [place] [address]   CBC     Status: Abnormal   Collection Time: 02/17/23  8:01 AM  Result Value Ref Range   WBC 5.7 4.0 - 10.5 K/uL   RBC 3.93 3.87 - 5.11 MIL/uL   Hemoglobin 12.2 12.0 - 15.0 g/dL   HCT 65.7 (L) 84.6 - 96.2 %   MCV 89.1 80.0 - 100.0 fL   MCH 31.0 26.0 - 34.0 pg   MCHC 34.9 30.0 - 36.0 g/dL   RDW 95.2 84.1 - 32.4 %   Platelets 226 150 - 400 K/uL   nRBC  0.0 0.0 - 0.2 %  Comment: Performed at [facility], [address], [place], [place] [address]  hCG, quantitative, pregnancy     Status: Abnormal   Collection Time: 02/17/23  8:01 AM  Result Value Ref Range   hCG, Beta Chain, Quant, S 193,366 (H) <5 mIU/mL    Comment:          GEST. AGE      CONC.  (mIU/mL)   <=1 WEEK        5 - 50     2 WEEKS       50 - 500     3 WEEKS       100 - 10,000     4 WEEKS     1,000 - 30,000     5 WEEKS     3,500 - 115,000   6-8 WEEKS     12,000 - 270,000    12 WEEKS     15,000 - 220,000        FEMALE AND NON-PREGNANT FEMALE:     LESS THAN 5 mIU/mL Performed at [facility], [address], [place], [place] [address]   Comprehensive metabolic panel     Status: Abnormal   Collection Time: 02/17/23  8:01 AM  Result Value Ref Range   Sodium 136 135 - 145 mmol/L   Potassium 4.1 3.5 - 5.1 mmol/L   Chloride 104 98 - 111 mmol/L   CO2 22 22 - 32 mmol/L   Glucose, Bld 96 70 - 99 mg/dL    Comment: Glucose reference range applies only to samples taken after fasting for at least 8 hours.   BUN 6 6 - 20 mg/dL   Creatinine, Ser 1.30 0.44 - 1.00 mg/dL   Calcium 9.0 8.9 - 86.5 mg/dL   Total Protein 6.8 6.5 - 8.1 g/dL   Albumin 3.5 3.5 - 5.0 g/dL   AST 17 15 - 41 U/L   ALT 9 0 - 44 U/L   Alkaline Phosphatase 33 (L) 38 - 126 U/L   Total Bilirubin 0.6 0.0 - 1.2 mg/dL   GFR, Estimated >78 >46 mL/min    Comment: (NOTE) Calculated using the CKD-EPI Creatinine Equation (2021)    Anion gap 10 5 - 15    Comment: Performed at [facility], [address] [place], [place] [address]   US OB LESS THAN 14 WEEKS WITH OB TRANSVAGINAL Result Date: 02/17/2023 CLINICAL DATA:  Vaginal bleeding during pregnancy EXAM: OBSTETRIC <14 WK [place] AND TRANSVAGINAL OB US TECHNIQUE: Both transabdominal and transvaginal ultrasound examinations were performed for complete evaluation of the gestation as well as the maternal uterus, adnexal regions, and pelvic cul-de-sac.  Transvaginal technique was performed to assess early pregnancy. COMPARISON:  None of this gestation. FINDINGS: Intrauterine gestational sac: Single Yolk sac:  Visualized. Embryo:  Visualized. Cardiac Activity: Visualized. Heart Rate: 170 bpm CRL:  19 mm   8 w   3 d                  [place] EDC: 09/26/2023 Subchorionic hemorrhage: Diffusely present encircling the sac along the right, upper, and leftward aspects - relatively sparing the posterior aspect of the sac. The largest discrete pocket measures 3.5 x 2 cm. Maternal uterus/adnexae: Negative IMPRESSION: 1. Single living intrauterine pregnancy measuring 8 weeks 3 days. 2. Widespread subchorionic hemorrhage. Electronically Signed   By: Tiburcio Pea M.D.   On: 02/17/2023 08:56     MDM - UA noted some blood but otherwise  normal  - Wet prep positive for Clue cells, however asymptomatic. No tx required at this time.  - H&H normal. Patient hemodynamically stable.  Sharene Butters [id number] appropriate for gestational age.  - CNM normal for pregnancy.  - [place] result reviewed and noted a single living IUP measuring ~[redacted]w[redacted]d. Of note a significant SCH noted today as well.  - plan for discharge.   Assessment and Plan   1. Vaginal spotting   2. [redacted] weeks gestation of pregnancy   3. Physically well but worried   4. Abdominal pain, unspecified abdominal location   5. Threatened miscarriage   6. Subchorionic hemorrhage of placenta in first trimester    - Reviewed [place] finding of living IUP and dx of threatened miscarriage given large [facility].  - Discussed bleeding expectations and recommended pelvic rest until able to get an [place] at a later date.  -  Also recommended a viability scan in 2 weeks given the widespread [facility]: Lambert Rhonda W and patient agreeable to plan of care. Message sent to [facility] for [place].  - Reviewed worsening signs and return precautions.  - Patient discharged home in stable condition and may return to MAU as needed.   Claudette Head, MSN CNM  02/17/2023, 9:24 AM

## 2023-02-19 ENCOUNTER — Inpatient Hospital Stay (HOSPITAL_COMMUNITY)
Admission: AD | Admit: 2023-02-19 | Discharge: 2023-02-19 | Disposition: A | Discharge status: Home / Self Care | Payer: Medicaid Other | Attending: Obstetrics & Gynecology | Admitting: Obstetrics & Gynecology

## 2023-02-19 DIAGNOSIS — O99519 Diseases of the respiratory system complicating pregnancy, unspecified trimester: Secondary | ICD-10-CM | POA: Diagnosis not present

## 2023-02-19 DIAGNOSIS — R6889 Other general symptoms and signs: Secondary | ICD-10-CM

## 2023-02-19 DIAGNOSIS — R519 Headache, unspecified: Secondary | ICD-10-CM | POA: Diagnosis present

## 2023-02-19 DIAGNOSIS — Z3A08 8 weeks gestation of pregnancy: Secondary | ICD-10-CM

## 2023-02-19 DIAGNOSIS — Z3A09 9 weeks gestation of pregnancy: Secondary | ICD-10-CM | POA: Insufficient documentation

## 2023-02-19 DIAGNOSIS — R509 Fever, unspecified: Secondary | ICD-10-CM | POA: Insufficient documentation

## 2023-02-19 DIAGNOSIS — R0981 Nasal congestion: Secondary | ICD-10-CM | POA: Insufficient documentation

## 2023-02-19 DIAGNOSIS — R52 Pain, unspecified: Secondary | ICD-10-CM | POA: Diagnosis present

## 2023-02-19 LAB — GC/CHLAMYDIA PROBE AMP (~~LOC~~) NOT AT ARMC
Chlamydia: NEGATIVE
Comment: NEGATIVE
Comment: NORMAL
Neisseria Gonorrhea: NEGATIVE

## 2023-02-19 LAB — RESP PANEL BY RT-PCR (RSV, FLU A&B, COVID)  RVPGX2
Influenza A by PCR: NEGATIVE
Influenza B by PCR: NEGATIVE
Resp Syncytial Virus by PCR: NEGATIVE
SARS Coronavirus 2 by RT PCR: NEGATIVE

## 2023-02-19 MED ORDER — ACETAMINOPHEN 500 MG PO TABS: 1000.0000 mg | ORAL_TABLET | Freq: Once | ORAL | Status: DC

## 2023-02-19 NOTE — MAU Provider Note (Addendum)
     S Ms. KATHRYNE RAMELLA is a 33 y.o. [id number] patient who presents to MAU today with complaint of fever, chills, body aches, with nasal congestion and HA rated 7/10 on pain scale. Patient reports fever was yesterday and Tylenol resolved fever. Has not taken any medication for HA today and has not vaccinated for the flu .   O BP 104/66 (BP Location: Right Arm)   Pulse (!) 106   Temp 99.5 F (37.5 C) (Oral)   Resp 16   Ht 5\' 6"  (1.676 m)   Wt 59.1 kg   LMP 12/22/2022   BMI 21.03 kg/m  Physical Exam Vitals and nursing note reviewed. Exam conducted with a chaperone present.  Constitutional:      General: She is not in acute distress.    Appearance: She is ill-appearing.  HENT:     Head: Normocephalic.  Cardiovascular:     Rate and Rhythm: Normal rate.  Pulmonary:     Effort: Pulmonary effort is normal.     Breath sounds: Normal breath sounds.  Abdominal:     Palpations: Abdomen is soft.  Musculoskeletal:        General: Normal range of motion.  Skin:    General: Skin is warm.  Neurological:     Mental Status: She is alert and oriented to person, place, and time.  Psychiatric:        Mood and Affect: Mood normal.        Behavior: Behavior normal.        Thought Content: Thought content normal.        Judgment: Judgment normal.     A Medical screening exam complete Concern for Influenza ( Swab obtained) will call patient with results and treat appropriately since onset of symptoms  has been <24 hours  Respiratory precautions and COVID Precautions D/T high unit acuity and increased respiratory illnesses will discharge to self care and call patient with Results  Orders Placed This Encounter  Procedures   SARS Coronavirus 2 by RT PCR (hospital order, performed in [facility] hospital lab) *cepheid single result test* Anterior Nasal Swab    Standing Status:   Standing    Number of Occurrences:   1   Resp panel by RT-PCR (RSV, Flu A&B, Covid) Anterior Nasal Swab     Standing Status:   Standing    Number of Occurrences:   1   Airborne and Contact precautions    Standing Status:   Standing    Number of Occurrences:   1     P Discharge from MAU in stable condition Patient given the option of transfer to [facility] for further evaluation or seek care in outpatient facility of choice  List of options for follow-up given and safe medications to take in pregnancy  Warning signs for worsening condition that would warrant emergency follow-up discussed Patient may return to MAU as needed   Colman Cater, NP 02/19/2023 7:23 PM

## 2023-02-19 NOTE — MAU Note (Signed)
PNC- HD Yesterday am- Temp was 102.8-    Today at 1100 - Reg Tyl 1 tab  H/A 10/10 started yesterday - no other meds . H/A now 7/10 Runny nose and watery eyes  Vomited at 0800.

## 2023-02-19 NOTE — Discharge Instructions (Signed)

## 2023-02-21 ENCOUNTER — Other Ambulatory Visit: Payer: Self-pay | Admitting: *Deleted

## 2023-02-21 DIAGNOSIS — O200 Threatened abortion: Secondary | ICD-10-CM

## 2023-02-26 ENCOUNTER — Other Ambulatory Visit: Payer: Self-pay | Admitting: Certified Nurse Midwife

## 2023-02-26 ENCOUNTER — Ambulatory Visit (HOSPITAL_COMMUNITY)
Admission: RE | Admit: 2023-02-26 | Discharge: 2023-02-26 | Disposition: A | Discharge status: Home / Self Care | Payer: Medicaid Other | Source: Ambulatory Visit | Attending: Certified Nurse Midwife | Admitting: Certified Nurse Midwife

## 2023-02-26 DIAGNOSIS — O200 Threatened abortion: Secondary | ICD-10-CM | POA: Diagnosis present

## 2023-03-22 LAB — OB RESULTS CONSOLE RUBELLA ANTIBODY, IGM: Rubella: IMMUNE

## 2023-03-22 LAB — HEPATITIS C ANTIBODY: HCV Ab: NEGATIVE

## 2023-03-22 LAB — OB RESULTS CONSOLE HEPATITIS B SURFACE ANTIGEN: Hepatitis B Surface Ag: NEGATIVE

## 2023-07-11 LAB — OB RESULTS CONSOLE RPR: RPR: NONREACTIVE

## 2023-07-11 LAB — OB RESULTS CONSOLE HIV ANTIBODY (ROUTINE TESTING): HIV: NONREACTIVE

## 2023-08-31 LAB — OB RESULTS CONSOLE GBS: GBS: NEGATIVE

## 2023-08-31 LAB — OB RESULTS CONSOLE GC/CHLAMYDIA
Chlamydia: NEGATIVE
Neisseria Gonorrhea: NEGATIVE

## 2023-09-26 ENCOUNTER — Encounter (HOSPITAL_COMMUNITY): Payer: Self-pay | Admitting: Obstetrics & Gynecology

## 2023-09-26 ENCOUNTER — Inpatient Hospital Stay (HOSPITAL_COMMUNITY)
Admission: AD | Admit: 2023-09-26 | Discharge: 2023-09-28 | DRG: 807 | Disposition: A | Discharge status: Home / Self Care | Attending: Obstetrics and Gynecology | Admitting: Obstetrics and Gynecology

## 2023-09-26 DIAGNOSIS — O09523 Supervision of elderly multigravida, third trimester: Secondary | ICD-10-CM | POA: Diagnosis not present

## 2023-09-26 DIAGNOSIS — Z8249 Family history of ischemic heart disease and other diseases of the circulatory system: Secondary | ICD-10-CM | POA: Diagnosis not present

## 2023-09-26 DIAGNOSIS — Z3A39 39 weeks gestation of pregnancy: Secondary | ICD-10-CM

## 2023-09-26 DIAGNOSIS — Z833 Family history of diabetes mellitus: Secondary | ICD-10-CM | POA: Diagnosis not present

## 2023-09-26 DIAGNOSIS — O4202 Full-term premature rupture of membranes, onset of labor within 24 hours of rupture: Secondary | ICD-10-CM | POA: Diagnosis not present

## 2023-09-26 DIAGNOSIS — O26893 Other specified pregnancy related conditions, third trimester: Secondary | ICD-10-CM | POA: Diagnosis present

## 2023-09-26 LAB — CBC
HCT: 36.1 % (ref 36.0–46.0)
Hemoglobin: 12.3 g/dL (ref 12.0–15.0)
MCH: 32.4 pg (ref 26.0–34.0)
MCHC: 34.1 g/dL (ref 30.0–36.0)
MCV: 95 fL (ref 80.0–100.0)
Platelets: 239 K/uL (ref 150–400)
RBC: 3.8 MIL/uL — ABNORMAL LOW (ref 3.87–5.11)
RDW: 14.3 % (ref 11.5–15.5)
WBC: 8.3 K/uL (ref 4.0–10.5)
nRBC: 0 % (ref 0.0–0.2)

## 2023-09-26 LAB — COMPREHENSIVE METABOLIC PANEL WITH GFR
ALT: 12 U/L (ref 0–44)
AST: 20 U/L (ref 15–41)
Albumin: 3.2 g/dL — ABNORMAL LOW (ref 3.5–5.0)
Alkaline Phosphatase: 100 U/L (ref 38–126)
Anion gap: 10 (ref 5–15)
BUN: 7 mg/dL (ref 6–20)
CO2: 22 mmol/L (ref 22–32)
Calcium: 9.1 mg/dL (ref 8.9–10.3)
Chloride: 104 mmol/L (ref 98–111)
Creatinine, Ser: 0.72 mg/dL (ref 0.44–1.00)
GFR, Estimated: >60 mL/min (ref >60)
Glucose, Bld: 85 mg/dL (ref 70–99)
Potassium: 3.8 mmol/L (ref 3.5–5.1)
Sodium: 136 mmol/L (ref 135–145)
Total Bilirubin: 0.6 mg/dL (ref 0.0–1.2)
Total Protein: 6.8 g/dL (ref 6.5–8.1)

## 2023-09-26 LAB — TYPE AND SCREEN
ABO/RH(D): B POS
Antibody Screen: NEGATIVE

## 2023-09-26 MED ORDER — LACTATED RINGERS IV SOLN: INTRAVENOUS | Status: DC

## 2023-09-26 MED ORDER — TRANEXAMIC ACID-NACL 1000-0.7 MG/100ML-% IV SOLN
1000.0000 mg | INTRAVENOUS | Status: AC
  Administered 2023-09-26: 1000 mg via INTRAVENOUS

## 2023-09-26 MED ORDER — ONDANSETRON HCL 4 MG/2ML IJ SOLN: 4.0000 mg | INTRAMUSCULAR | Status: DC | PRN

## 2023-09-26 MED ORDER — METHYLERGONOVINE MALEATE 0.2 MG/ML IJ SOLN: 0.2000 mg | Freq: Once | INTRAMUSCULAR | Status: DC

## 2023-09-26 MED ORDER — WITCH HAZEL-GLYCERIN EX PADS: 1.0000 | MEDICATED_PAD | CUTANEOUS | Status: DC | PRN

## 2023-09-26 MED ORDER — ONDANSETRON HCL 4 MG PO TABS: 4.0000 mg | ORAL_TABLET | ORAL | Status: DC | PRN

## 2023-09-26 MED ORDER — ACETAMINOPHEN 325 MG PO TABS: 650.0000 mg | ORAL_TABLET | ORAL | Status: DC | PRN

## 2023-09-26 MED ORDER — OXYCODONE-ACETAMINOPHEN 5-325 MG PO TABS: 2.0000 | ORAL_TABLET | ORAL | Status: DC | PRN

## 2023-09-26 MED ORDER — SENNOSIDES-DOCUSATE SODIUM 8.6-50 MG PO TABS
2.0000 | ORAL_TABLET | Freq: Every day | ORAL | Status: DC
  Administered 2023-09-27 – 2023-09-28 (×2): 2 via ORAL
  Filled 2023-09-26 (×2): qty 2

## 2023-09-26 MED ORDER — OXYTOCIN-SODIUM CHLORIDE 30-0.9 UT/500ML-% IV SOLN
2.5000 [IU]/h | INTRAVENOUS | Status: DC
  Administered 2023-09-26: 2.5 [IU]/h via INTRAVENOUS
  Filled 2023-09-26: qty 500

## 2023-09-26 MED ORDER — PRENATAL MULTIVITAMIN CH
1.0000 | ORAL_TABLET | Freq: Every day | ORAL | Status: DC
  Administered 2023-09-27 – 2023-09-28 (×2): 1 via ORAL
  Filled 2023-09-26 (×2): qty 1

## 2023-09-26 MED ORDER — OXYTOCIN BOLUS FROM INFUSION
333.0000 mL | Freq: Once | INTRAVENOUS | Status: AC
  Administered 2023-09-26: 333 mL via INTRAVENOUS

## 2023-09-26 MED ORDER — DIPHENHYDRAMINE HCL 25 MG PO CAPS: 25.0000 mg | ORAL_CAPSULE | Freq: Four times a day (QID) | ORAL | Status: DC | PRN

## 2023-09-26 MED ORDER — OXYCODONE-ACETAMINOPHEN 5-325 MG PO TABS: 1.0000 | ORAL_TABLET | ORAL | Status: DC | PRN

## 2023-09-26 MED ORDER — TETANUS-DIPHTH-ACELL PERTUSSIS 5-2.5-18.5 LF-MCG/0.5 IM SUSY: 0.5000 mL | PREFILLED_SYRINGE | Freq: Once | INTRAMUSCULAR | Status: DC

## 2023-09-26 MED ORDER — ACETAMINOPHEN 325 MG PO TABS
650.0000 mg | ORAL_TABLET | ORAL | Status: DC | PRN
  Administered 2023-09-27 – 2023-09-28 (×5): 650 mg via ORAL
  Filled 2023-09-26 (×6): qty 2

## 2023-09-26 MED ORDER — SOD CITRATE-CITRIC ACID 500-334 MG/5ML PO SOLN: 30.0000 mL | ORAL | Status: DC | PRN

## 2023-09-26 MED ORDER — FLEET ENEMA RE ENEM: 1.0000 | ENEMA | RECTAL | Status: DC | PRN

## 2023-09-26 MED ORDER — SIMETHICONE 80 MG PO CHEW: 80.0000 mg | CHEWABLE_TABLET | ORAL | Status: DC | PRN

## 2023-09-26 MED ORDER — FENTANYL CITRATE (PF) 100 MCG/2ML IJ SOLN
50.0000 ug | INTRAMUSCULAR | Status: DC | PRN
  Administered 2023-09-26: 100 ug via INTRAVENOUS
  Filled 2023-09-26: qty 2

## 2023-09-26 MED ORDER — LIDOCAINE HCL (PF) 1 % IJ SOLN: 30.0000 mL | INTRAMUSCULAR | Status: DC | PRN

## 2023-09-26 MED ORDER — DIBUCAINE (PERIANAL) 1 % EX OINT: 1.0000 | TOPICAL_OINTMENT | CUTANEOUS | Status: DC | PRN

## 2023-09-26 MED ORDER — LACTATED RINGERS IV SOLN
500.0000 mL | INTRAVENOUS | Status: DC | PRN
  Administered 2023-09-26 (×2): 1000 mL via INTRAVENOUS

## 2023-09-26 MED ORDER — ONDANSETRON HCL 4 MG/2ML IJ SOLN: 4.0000 mg | Freq: Four times a day (QID) | INTRAMUSCULAR | Status: DC | PRN

## 2023-09-26 MED ORDER — ZOLPIDEM TARTRATE 5 MG PO TABS: 5.0000 mg | ORAL_TABLET | Freq: Every evening | ORAL | Status: DC | PRN

## 2023-09-26 MED ORDER — METHYLERGONOVINE MALEATE 0.2 MG PO TABS
0.2000 mg | ORAL_TABLET | ORAL | Status: AC
  Administered 2023-09-26 – 2023-09-27 (×5): 0.2 mg via ORAL
  Filled 2023-09-26 (×5): qty 1

## 2023-09-26 MED ORDER — COCONUT OIL OIL: 1.0000 | TOPICAL_OIL | Status: DC | PRN

## 2023-09-26 MED ORDER — METHYLERGONOVINE MALEATE 0.2 MG/ML IJ SOLN
INTRAMUSCULAR | Status: AC
  Administered 2023-09-26: 0.2 mg via INTRAMUSCULAR
  Filled 2023-09-26: qty 1

## 2023-09-26 NOTE — Discharge Summary (Signed)
 Postpartum Discharge Summary  Date of Service updated***     Patient Name: Shannon Hernandez DOB: 1990-11-10 MRN: [id number]  Date of admission: 09/26/2023 Delivery date:09/26/2023 Delivering provider: JOMARIE CAMPI A Date of discharge: 09/26/2023  Admitting diagnosis: Labor and delivery indication for care or intervention [O75.9] Intrauterine pregnancy: [redacted]w[redacted]d     Secondary diagnosis:  Principal Problem:   Labor and delivery indication for care or intervention  Additional problems: None    Discharge diagnosis: Term Pregnancy Delivered                                              Post partum procedures:{Postpartum procedures:23558} Augmentation: AROM Complications: None  Hospital course: Onset of Labor With Vaginal Delivery      33 y.o. yo [id number] at [redacted]w[redacted]d was admitted in Active Labor on 09/26/2023. Labor course was uncomplicated Membrane Rupture Time/Date: 5:00 PM,09/26/2023  Delivery Method:Vaginal, Spontaneous Operative Delivery:N/A Episiotomy: None Lacerations:  None Patient had a postpartum course complicated by ***.  She is ambulating, tolerating a regular diet, passing flatus, and urinating well. Patient is discharged home in stable condition on 09/26/23.  Newborn Data: Birth date:09/26/2023 Birth time:5:50 PM Gender:Female Living status:Living Apgars:8 ,9  Weight:3790 g  Magnesium Sulfate received: {Mag received:30440022} BMZ received: {BMZ received:30440023} Rhophylac:{Rhophylac received:30440032} FFM:{FFM:69559966} T-DaP:{Tdap:23962} Flu: {Qol:76036} RSV Vaccine received: {RSV:31013} Transfusion:{Transfusion received:30440034}  Immunizations received: Immunization History  Administered Date(s) Administered   Tdap 02/10/2011    Physical exam  Vitals:   09/26/23 1800 09/26/23 1815 09/26/23 1817 09/26/23 1832  BP: 101/63 (!) 81/52 105/61 99/69  Pulse: 81 80 78 72  Resp:      Temp:    98.6 F (37 C)  TempSrc:    Axillary  Weight:      Height:        General: {Exam; general:21111117} Lochia: {Desc; appropriate/inappropriate:30686::appropriate} Uterine Fundus: {Desc; firm/soft:30687} Incision: {Exam; incision:21111123} DVT Evaluation: {Exam; dvt:2111122} Labs: Lab Results  Component Value Date   WBC 8.3 09/26/2023   HGB 12.3 09/26/2023   HCT 36.1 09/26/2023   MCV 95.0 09/26/2023   PLT 239 09/26/2023      Latest Ref Rng & Units 09/26/2023    4:41 PM  CMP  Glucose 70 - 99 mg/dL 85   BUN 6 - 20 mg/dL 7   Creatinine 9.55 - 8.99 mg/dL 9.27   Sodium 864 - 854 mmol/L 136   Potassium 3.5 - 5.1 mmol/L 3.8   Chloride 98 - 111 mmol/L 104   CO2 22 - 32 mmol/L 22   Calcium 8.9 - 10.3 mg/dL 9.1   Total Protein 6.5 - 8.1 g/dL 6.8   Total Bilirubin 0.0 - 1.2 mg/dL 0.6   Alkaline Phos 38 - 126 U/L 100   AST 15 - 41 U/L 20   ALT 0 - 44 U/L 12    Edinburgh Score:    11/13/2019    9:45 AM  Edinburgh Postnatal Depression Scale Screening Tool  I have been able to laugh and see the funny side of things. 0  I have looked forward with enjoyment to things. 0  I have blamed myself unnecessarily when things went wrong. 0  I have been anxious or worried for no good reason. 0  I have felt scared or panicky for no good reason. 0  Things have been getting on top of me. 0  I have  been so unhappy that I have had difficulty sleeping. 0  I have felt sad or miserable. 0  I have been so unhappy that I have been crying. 0  The thought of harming myself has occurred to me. 0  Edinburgh Postnatal Depression Scale Total 0      Data saved with a previous flowsheet row definition   No data recorded  After visit meds:  Allergies as of 09/26/2023       Reactions   Tropic-proparaca-pe-ketorolac  Itching, Swelling     Med Rec must be completed prior to using this [facility]***        Discharge home in stable condition Infant Feeding: Bottle and Breast Infant Disposition:{CHL IP OB HOME WITH FNUYZM:76418} Discharge instruction: per After  Visit Summary and Postpartum booklet. Activity: Advance as tolerated. Pelvic rest for 6 weeks.  Diet: {OB ipzu:78888878} Future Appointments:No future appointments.   Follow up Visit:  Please schedule this patient for a In person postpartum visit in 4-6 weeks with the following provider: Any provider. Additional Postpartum F/U:None  Low risk pregnancy uncomplicated Delivery mode:  Vaginal, Spontaneous Anticipated Birth Control:  None  Message not sent to [facility]   09/26/2023 Charlie DELENA Courts, MD

## 2023-09-26 NOTE — MAU Note (Signed)
.  Shannon Hernandez is a 33 y.o. at [redacted]w[redacted]d here in MAU reporting: CTX that started at 0600 this morning and are 5-15 mins apart. Reports +FM. Denies LOF, VB.  LMP: na Onset of complaint: 0600 Pain score: UTA There were no vitals filed for this visit.   FHT: 140  Lab orders placed from triage: labor eval

## 2023-09-26 NOTE — H&P (Signed)
 OBSTETRIC ADMISSION HISTORY AND PHYSICAL  Shannon Hernandez is a 33 y.o. female [phone] with IUP at [redacted]w[redacted]d by LMP c/w U/S presenting for spontaneous onset of labor. She reports +FMs, No LOF, no VB, no blurry vision, headaches or peripheral edema, and RUQ pain.  She plans on breast & formula feeding. She request Depo for birth control. She received her prenatal care at [facility]   Dating: By LMP c/w U/S --->  Estimated Date of Delivery: 09/28/23  Sono:    @[redacted]w[redacted]d , CWD, normal anatomy, cephalic presentation, 406g, 85% EFW   Prenatal History/Complications: AMA,   Past Medical History: Past Medical History:  Diagnosis Date   Abnormal Pap smear    ASCUS   Anemia    Choroid plexus cysts, fetal, affecting care of mother, antepartum    Vaginal Pap smear, abnormal     Past Surgical History: Past Surgical History:  Procedure Laterality Date   NO PAST SURGERIES      Obstetrical History: OB History     Gravida  8   Para  7   Term  7   Preterm      AB      Living  7      SAB      IAB      Ectopic      Multiple  0   Live Births  7           Social History Social History   Socioeconomic History   Marital status: Married    Spouse name: Not on file   Number of children: 5   Years of education: Not on file   Highest education level: Not on file  Occupational History   Not on file  Tobacco Use   Smoking status: Never   Smokeless tobacco: Never  Vaping Use   Vaping status: Never Used  Substance and Sexual Activity   Alcohol use: No   Drug use: No   Sexual activity: Not Currently    Birth control/protection: Pill  Other Topics Concern   Not on file  Social History Narrative   Not on file   Social Drivers of Health   Financial Resource Strain: Low Risk  (11/04/2017)   Overall Financial Resource Strain (CARDIA)    Difficulty of Paying Living Expenses: Not very hard  Food Insecurity: No Food Insecurity (11/04/2017)   Hunger Vital Sign    Worried About  Running Out of Food in the Last Year: Never true    Ran Out of Food in the Last Year: Never true  Transportation Needs: No Transportation Needs (11/04/2017)   PRAPARE - Administrator, Civil Service (Medical): No    Lack of Transportation (Non-Medical): No  Physical Activity: Not on file  Stress: Not on file  Social Connections: Not on file    Family History: Family History  Problem Relation Age of Onset   Healthy Mother    Heart attack Father    Asthma Brother    Diabetes Paternal Grandfather     Allergies: Allergies  Allergen Reactions   Tropic-Proparaca-Pe-Ketorolac  Itching and Swelling    Medications Prior to Admission  Medication Sig Dispense Refill Last Dose/Taking   Prenatal Vit-Fe Fumarate-FA (MULTIVITAMIN-PRENATAL) 27-0.8 MG TABS tablet Take 1 tablet by mouth daily at 12 noon.        Review of Systems   All systems reviewed and negative except as stated in HPI  Height 5' 6 (1.676 m), weight 68 kg, last menstrual period 12/22/2022.  General appearance: alert, cooperative, appears stated age, and uncomfortable Lungs: clear to auscultation bilaterally Heart: regular rate and rhythm Abdomen: soft, non-tender; bowel sounds normal Pelvic: deferred Extremities: Homans sign is negative, no sign of DVT Presentation: cephalic Fetal monitoringBaseline: 145 bpm, Variability: Good {> 6 bpm), Accelerations: Reactive, and Decelerations: Absent Uterine activityFrequency: Every 5 minutes     Prenatal labs: ABO, Rh: --/--/B POS (02/01 0759) Antibody:   Rubella:   RPR:    HBsAg:    HIV:    GBS:      Lab Results  Component Value Date   GBS Negative 10/11/2017   GTT normal Genetic screening normal Anatomy US  normal  Immunization History  Administered Date(s) Administered   Tdap 02/10/2011    Prenatal Transfer Tool  Maternal Diabetes: No Genetic Screening: Normal Maternal Ultrasounds/Referrals: Normal Fetal Ultrasounds or other Referrals:   None Maternal Substance Abuse:  No Significant Maternal Medications:  None Significant Maternal Lab Results: Group B Strep negative Number of Prenatal Visits:greater than 3 verified prenatal visits Maternal Vaccinations:TDap Other Comments:  None   No results found for this or any previous visit (from the past 24 hours).  Patient Active Problem List   Diagnosis Date Noted   Subchorionic hematoma 02/17/2023   Grand multipara in labor 11/12/2019   SVD (spontaneous vaginal delivery) 11/04/2017   Seasonal allergies 12/13/2011   Underweight 12/13/2011    Assessment/Plan:  Shannon Hernandez is a 33 y.o. H1E2992 at [redacted]w[redacted]d here for SOL  #Labor:Expectant management #Pain: None #FWB: Category I #GBS status:  negative #Feeding: Breastmilk  and Formula #Reproductive Life planning: None #Circ:  yes  Charlie DELENA Courts, MD  09/26/2023, 4:26 PM

## 2023-09-26 NOTE — Lactation Note (Signed)
 This note was copied from a baby's chart. Lactation Consultation Note  Patient Name: Boy Mikalyn Hermida Unijb'd Date: 09/26/2023 Age:35 hours   Experienced BF mom declines Lactation services.  Maternal Data    Feeding Nipple Type: Slow - flow  LATCH Score                    Lactation Tools Discussed/Used    Interventions    Discharge    Consult Status Consult Status: Complete    Kannon Baum G 09/26/2023, 8:41 PM

## 2023-09-27 LAB — CBC
HCT: 33.3 % — ABNORMAL LOW (ref 36.0–46.0)
Hemoglobin: 11.6 g/dL — ABNORMAL LOW (ref 12.0–15.0)
MCH: 32.7 pg (ref 26.0–34.0)
MCHC: 34.8 g/dL (ref 30.0–36.0)
MCV: 93.8 fL (ref 80.0–100.0)
Platelets: 229 K/uL (ref 150–400)
RBC: 3.55 MIL/uL — ABNORMAL LOW (ref 3.87–5.11)
RDW: 14.1 % (ref 11.5–15.5)
WBC: 11.6 K/uL — ABNORMAL HIGH (ref 4.0–10.5)
nRBC: 0 % (ref 0.0–0.2)

## 2023-09-27 LAB — RPR: RPR Ser Ql: NONREACTIVE

## 2023-09-27 LAB — BIRTH TISSUE RECOVERY COLLECTION (PLACENTA DONATION)

## 2023-09-27 MED ORDER — IBUPROFEN 600 MG PO TABS
600.0000 mg | ORAL_TABLET | Freq: Four times a day (QID) | ORAL | Status: DC | PRN
  Administered 2023-09-27 – 2023-09-28 (×5): 600 mg via ORAL
  Filled 2023-09-27 (×5): qty 1

## 2023-09-27 MED ORDER — OXYCODONE HCL 5 MG PO TABS
5.0000 mg | ORAL_TABLET | ORAL | Status: DC | PRN
  Administered 2023-09-27: 5 mg via ORAL
  Administered 2023-09-27 – 2023-09-28 (×4): 10 mg via ORAL
  Filled 2023-09-27 (×4): qty 2
  Filled 2023-09-27: qty 1

## 2023-09-27 MED ORDER — BENZOCAINE-MENTHOL 20-0.5 % EX AERO
1.0000 | INHALATION_SPRAY | Freq: Four times a day (QID) | CUTANEOUS | Status: DC | PRN
  Administered 2023-09-27: 1 via TOPICAL
  Filled 2023-09-27: qty 56

## 2023-09-27 MED ORDER — MEDROXYPROGESTERONE ACETATE 150 MG/ML IM SUSP
150.0000 mg | Freq: Once | INTRAMUSCULAR | Status: AC
  Administered 2023-09-28: 150 mg via INTRAMUSCULAR
  Filled 2023-09-27: qty 1

## 2023-09-27 NOTE — Progress Notes (Signed)
 Patient declines tylenol , requests ibuprofen .  I explained to her that it was not ordered, most likely due to her having a documented allergy to toradol /ketorolac .  She says she routinely takes ibuprofen  at home and has never had a problem.  I called 1st call L[facility]D Rick Gentry, CNM) and she said it was ok to order 600mg  ibuprofen  PO q6hrs prn.  Also mentioned to her that there is no dermaplast spray ordered either. She also said it was ok to order the dermaplast.  I messaged pharmacy after putting in the verbal order to let them know what the patient had told me regarding no previous reactions to ibuprofen .

## 2023-09-27 NOTE — Progress Notes (Signed)
 POSTPARTUM PROGRESS NOTE  Post Partum Day 1  Subjective:  Shannon Hernandez is a 33 y.o. H1E1991 s/p SVD at [redacted]w[redacted]d.  She reports she is doing well. No acute events overnight. She denies any problems with ambulating, voiding or po intake. Denies nausea or vomiting.  Pain is poorly controlled.  Lochia is Normal.  Objective: Blood pressure 99/65, pulse 60, temperature 97.9 F (36.6 C), temperature source Oral, resp. rate 17, height 5' 6 (1.676 m), weight 68 kg, last menstrual period 12/22/2022, SpO2 100%, unknown if currently breastfeeding.  BP Readings from Last 3 Encounters:  09/27/23 99/65  02/19/23 104/66  02/17/23 107/61    Physical Exam:  General: alert, cooperative and no distress Chest: no respiratory distress Heart:regular rate Uterine Fundus: firm, appropriately tender DVT Evaluation: No calf swelling or tenderness Extremities: none edema Skin: warm, dry  Recent Labs    09/26/23 1641 09/27/23 0424  HGB 12.3 11.6*  HCT 36.1 33.3*    Assessment/Plan: Shannon Hernandez is a 33 y.o. H1E1991 s/p NSVD at [redacted]w[redacted]d   PPD# 1 - Doing well  Routine postpartum care  Pain inadequately controlled-oxycodone  ordered  Delivery Complications: none TXA and methergine  series ordered prophylactically Blood Pressure: normal Anemia/Hb Status: Stable, appropriate postpartum Hb, no intervention indicated Contraception: Inpatient Depo Provera  Feeding: bottle feeding Baby consented for circumcision--see baby chart  Dispo: Plan for discharge tomorrow due to late delivery.   LOS: 1 day   Leeroy KATHEE Pouch, MD OB Fellow  09/27/2023, 11:15 AM

## 2023-09-28 MED ORDER — ACETAMINOPHEN 325 MG PO TABS: 650.0000 mg | ORAL_TABLET | ORAL | 0 refills | Status: AC | PRN

## 2023-09-28 MED ORDER — OXYCODONE HCL 5 MG PO TABS: 5.0000 mg | ORAL_TABLET | ORAL | 0 refills | Status: AC | PRN

## 2023-09-28 MED ORDER — IBUPROFEN 600 MG PO TABS: 600.0000 mg | ORAL_TABLET | Freq: Four times a day (QID) | ORAL | 0 refills | Status: AC | PRN

## 2023-09-28 NOTE — Patient Instructions (Signed)
 If interested in an outpatient lactation consult in office or virtually please reach out to us  at [facility] (First Floor) [address], [place]  Please call [phone] and press [phone] for lactation.    Lactation support groups:  [facility], Tuesdays 10:00 am -12:00 pm at [address] on the second floor in the conference room, lactating parents and lap babies welcome.  [email or web address]  [email or web address]   Geraldina Louder, [facility] for [facility]

## 2023-10-06 ENCOUNTER — Telehealth (HOSPITAL_COMMUNITY): Payer: Self-pay

## 2023-10-06 NOTE — Telephone Encounter (Signed)
 10/06/2023 1837  Name: ADAEZE BETTER MRN: [id number] DOB: 1990-06-23  Reason for Call:  Transition of Care Hospital Discharge Call  Contact Status: Patient Contact Status: Message  Language assistant needed:          Follow-Up Questions:    Van Postnatal Depression Scale:  In the Past 7 Days:    PHQ2-9 Depression Scale:     Discharge Follow-up:    Post-discharge interventions: NA  Signature  Rosaline Deretha PEAK

## 2023-12-31 ENCOUNTER — Telehealth (INDEPENDENT_AMBULATORY_CARE_PROVIDER_SITE_OTHER): Payer: Self-pay | Admitting: Primary Care

## 2023-12-31 NOTE — Telephone Encounter (Signed)
 Pt will be present at appt.

## 2024-01-01 ENCOUNTER — Ambulatory Visit (INDEPENDENT_AMBULATORY_CARE_PROVIDER_SITE_OTHER): Admitting: Primary Care

## 2024-01-01 ENCOUNTER — Encounter (INDEPENDENT_AMBULATORY_CARE_PROVIDER_SITE_OTHER): Payer: Self-pay | Admitting: Primary Care

## 2024-01-01 VITALS — BP 108/72 | HR 92 | Resp 16 | Ht 66.0 in | Wt 155.0 lb

## 2024-01-01 DIAGNOSIS — J302 Other seasonal allergic rhinitis: Secondary | ICD-10-CM | POA: Diagnosis not present

## 2024-01-01 DIAGNOSIS — Z30011 Encounter for initial prescription of contraceptive pills: Secondary | ICD-10-CM

## 2024-01-01 LAB — POCT URINE PREGNANCY: Preg Test, Ur: NEGATIVE

## 2024-01-01 MED ORDER — NORETHINDRONE-ETH ESTRADIOL 1-35 MG-MCG PO TABS: 1.0000 | ORAL_TABLET | Freq: Every day | ORAL | 11 refills | Status: AC

## 2024-01-01 MED ORDER — LORATADINE 10 MG PO TABS: 10.0000 mg | ORAL_TABLET | Freq: Every day | ORAL | 11 refills | Status: AC

## 2024-01-01 NOTE — Progress Notes (Unsigned)
° °   [facility]  GYN CONTRACEPTION VISIT Patient name: Shannon Hernandez MRN [id number]  Date of birth: [birth year] Chief Complaint:   Contraception and Allergies (Pt is requesting something for her allergies )  History of Present Illness:   Shannon Hernandez is a 33 y.o. H1E1991 {Race/ethnicity:17218} female being seen today for ***.     Patient's last menstrual period was 12/09/2023. The current method of family planning is {contraceptive method:5051}.  Last pap ***. Results were:  {Desc; normal/abnormal w/wildcard:19060} Review of Systems:   Pertinent items are noted in HPI Denies fever/chills, dizziness, headaches, visual disturbances, fatigue, shortness of breath, chest pain, abdominal pain, vomiting, abnormal vaginal discharge/itching/odor/irritation, problems with periods, bowel movements, urination, or intercourse unless otherwise stated above.  Pertinent History Reviewed:  Reviewed past medical,surgical, social, obstetrical and family history.  Reviewed problem list, medications and allergies. Physical Assessment:   Vitals:   01/01/24 1101  BP: 108/72  Pulse: 92  Resp: 16  SpO2: 100%  Weight: 155 lb (70.3 kg)  Height: 5' 6 (1.676 m)  Body mass index is 25.02 kg/m.       Physical Examination:   General appearance: alert, well appearing, and in no distress  Mental status: alert, oriented to person, place, and time  Skin: warm & dry   Cardiovascular: normal heart rate noted  Respiratory: normal respiratory effort, no distress  Abdomen: soft, non-tender   Pelvic: {:315900}  Extremities: no edema   Results for orders placed or performed in visit on 01/01/24 (from the past 24 hours)  POCT urine pregnancy   Collection Time: 01/01/24 11:27 AM  Result Value Ref Range   Preg Test, Ur Negative Negative    Assessment & Plan:  1) ***> ***  2) ***> ***  Meds: No orders of the defined types were placed in this encounter.   Orders Placed This Encounter   Procedures   POCT urine pregnancy    No follow-ups on file.  This note has been created with Education officer, environmental. Any transcriptional errors are unintentional.   Shannon [name], NP 01/01/2024, 11:44 AM

## 2024-01-22 ENCOUNTER — Emergency Department (HOSPITAL_BASED_OUTPATIENT_CLINIC_OR_DEPARTMENT_OTHER)

## 2024-01-22 ENCOUNTER — Other Ambulatory Visit: Payer: Self-pay

## 2024-01-22 ENCOUNTER — Emergency Department (HOSPITAL_BASED_OUTPATIENT_CLINIC_OR_DEPARTMENT_OTHER)
Admission: EM | Admit: 2024-01-22 | Discharge: 2024-01-22 | Disposition: A | Discharge status: Home / Self Care | Attending: Emergency Medicine | Admitting: Emergency Medicine

## 2024-01-22 ENCOUNTER — Encounter (HOSPITAL_BASED_OUTPATIENT_CLINIC_OR_DEPARTMENT_OTHER): Payer: Self-pay

## 2024-01-22 DIAGNOSIS — R42 Dizziness and giddiness: Secondary | ICD-10-CM | POA: Insufficient documentation

## 2024-01-22 DIAGNOSIS — R519 Headache, unspecified: Secondary | ICD-10-CM | POA: Insufficient documentation

## 2024-01-22 DIAGNOSIS — Z79899 Other long term (current) drug therapy: Secondary | ICD-10-CM | POA: Insufficient documentation

## 2024-01-22 DIAGNOSIS — R000 Tachycardia, unspecified: Secondary | ICD-10-CM | POA: Diagnosis not present

## 2024-01-22 LAB — COMPREHENSIVE METABOLIC PANEL WITH GFR
ALT: 16 U/L (ref 0–44)
AST: 19 U/L (ref 15–41)
Albumin: 4.8 g/dL (ref 3.5–5.0)
Alkaline Phosphatase: 55 U/L (ref 38–126)
Anion gap: 12 (ref 5–15)
BUN: 10 mg/dL (ref 6–20)
CO2: 24 mmol/L (ref 22–32)
Calcium: 10.3 mg/dL (ref 8.9–10.3)
Chloride: 104 mmol/L (ref 98–111)
Creatinine, Ser: 0.8 mg/dL (ref 0.44–1.00)
GFR, Estimated: >60 mL/min
Glucose, Bld: 121 mg/dL — ABNORMAL HIGH (ref 70–99)
Potassium: 4.1 mmol/L (ref 3.5–5.1)
Sodium: 140 mmol/L (ref 135–145)
Total Bilirubin: 0.3 mg/dL (ref 0.0–1.2)
Total Protein: 8.3 g/dL — ABNORMAL HIGH (ref 6.5–8.1)

## 2024-01-22 LAB — CBC WITH DIFFERENTIAL/PLATELET
Abs Immature Granulocytes: 0.02 K/uL (ref 0.00–0.07)
Basophils Absolute: 0 K/uL (ref 0.0–0.1)
Basophils Relative: 1 %
Eosinophils Absolute: 0.3 K/uL (ref 0.0–0.5)
Eosinophils Relative: 5 %
HCT: 39.9 % (ref 36.0–46.0)
Hemoglobin: 13.8 g/dL (ref 12.0–15.0)
Immature Granulocytes: 0 %
Lymphocytes Relative: 31 %
Lymphs Abs: 1.9 K/uL (ref 0.7–4.0)
MCH: 30.7 pg (ref 26.0–34.0)
MCHC: 34.6 g/dL (ref 30.0–36.0)
MCV: 88.9 fL (ref 80.0–100.0)
Monocytes Absolute: 0.4 K/uL (ref 0.1–1.0)
Monocytes Relative: 7 %
Neutro Abs: 3.4 K/uL (ref 1.7–7.7)
Neutrophils Relative %: 56 %
Platelets: 283 K/uL (ref 150–400)
RBC: 4.49 MIL/uL (ref 3.87–5.11)
RDW: 12.4 % (ref 11.5–15.5)
WBC: 6 K/uL (ref 4.0–10.5)
nRBC: 0 % (ref 0.0–0.2)

## 2024-01-22 LAB — PREGNANCY, URINE: Preg Test, Ur: NEGATIVE

## 2024-01-22 MED ORDER — IOHEXOL 350 MG/ML SOLN
75.0000 mL | Freq: Once | INTRAVENOUS | Status: AC | PRN
  Administered 2024-01-22: 75 mL via INTRAVENOUS

## 2024-01-22 MED ORDER — DEXAMETHASONE SOD PHOSPHATE PF 10 MG/ML IJ SOLN
10.0000 mg | Freq: Once | INTRAMUSCULAR | Status: AC
  Administered 2024-01-22: 10 mg via INTRAVENOUS
  Filled 2024-01-22: qty 1

## 2024-01-22 MED ORDER — SODIUM CHLORIDE 0.9 % IV BOLUS
1000.0000 mL | Freq: Once | INTRAVENOUS | Status: AC
  Administered 2024-01-22: 1000 mL via INTRAVENOUS

## 2024-01-22 MED ORDER — METOCLOPRAMIDE HCL 5 MG/ML IJ SOLN
10.0000 mg | Freq: Once | INTRAMUSCULAR | Status: AC
  Administered 2024-01-22: 10 mg via INTRAVENOUS
  Filled 2024-01-22: qty 2

## 2024-01-22 MED ORDER — MECLIZINE HCL 25 MG PO TABS
12.5000 mg | ORAL_TABLET | Freq: Once | ORAL | Status: AC
  Administered 2024-01-22: 12.5 mg via ORAL
  Filled 2024-01-22: qty 1

## 2024-01-22 NOTE — ED Triage Notes (Signed)
 Pt c/o syncope approx 6am, c/o dizziness that is worse w standing, HA after. Advises she last remembers 10p, I don't remember getting up, going to the bathroom. Denies issues w PO intake  Tylenol  approx 7a for HA, helped some.

## 2024-01-22 NOTE — ED Provider Notes (Signed)
 " [place] [facility] AT [facility] Provider Note   CSN: [id number] Arrival date & time: 01/22/24  1540     Patient presents with: Dizziness, Loss of Consciousness, and Fall   Shannon Hernandez is a 34 y.o. female.   34 y.o. female  was evaluated in triage.  Pt complains of headache, dizziness (light headed, worse with standing). No relief with Tylenol  at 7am. This morning was in the bathroom when she passed out. Does not recall any details. Does not recall going to the bathroom or getting out of bed this morning. States her alarm typically goes off at 6am in the morning to get her kids ready for school. Pain located to the crown of her head.  No cardiac history Only takes a multivitamin daily, no other medications, no substance use history.        Prior to Admission medications  Medication Sig Start Date End Date Taking? Authorizing Provider  acetaminophen  (TYLENOL ) 325 MG tablet Take 2 tablets (650 mg total) by mouth every 4 (four) hours as needed (for pain scale < 4). 09/28/23   Walker, Jamilla R, CNM  ibuprofen  (ADVIL ) 600 MG tablet Take 1 tablet (600 mg total) by mouth every 6 (six) hours as needed for cramping or moderate pain (pain score 4-6). 09/28/23   Vannie Cornell SAUNDERS, CNM  loratadine  (CLARITIN ) 10 MG tablet Take 1 tablet (10 mg total) by mouth daily. 01/01/24   Celestia Rosaline SQUIBB, NP  norethindrone -ethinyl estradiol  1/35 (ORTHO-NOVUM ) tablet Take 1 tablet by mouth daily. 01/01/24   Celestia Rosaline SQUIBB, NP  oxyCODONE  (ROXICODONE ) 5 MG immediate release tablet Take 1 tablet (5 mg total) by mouth every 4 (four) hours as needed for severe pain (pain score 7-10). 09/28/23   Vannie Cornell SAUNDERS, CNM  Prenatal Vit-Fe Fumarate-FA (MULTIVITAMIN-PRENATAL) 27-0.8 MG TABS tablet Take 1 tablet by mouth daily at 12 noon.    [provider]    Allergies: Tropic-proparaca-pe-ketorolac     Review of Systems Negative except as per HPI Updated Vital Signs BP 114/76    Pulse 73   Temp 98.2 F (36.8 C)   Resp 18   LMP 12/09/2023   SpO2 100%   Physical Exam Vitals and nursing note reviewed.  Constitutional:      General: She is not in acute distress.    Appearance: She is well-developed. She is not diaphoretic.  HENT:     Head: Normocephalic and atraumatic.     Nose: Nose normal.     Mouth/Throat:     Mouth: Mucous membranes are moist.     Pharynx: No oropharyngeal exudate or posterior oropharyngeal erythema.  Eyes:     Conjunctiva/sclera: Conjunctivae normal.     Pupils: Pupils are equal, round, and reactive to light.  Cardiovascular:     Rate and Rhythm: Normal rate and regular rhythm.     Heart sounds: Normal heart sounds.  Pulmonary:     Effort: Pulmonary effort is normal.     Breath sounds: Normal breath sounds.  Musculoskeletal:     Cervical back: Normal range of motion and neck supple. No tenderness.  Skin:    General: Skin is warm and dry.     Findings: No erythema or rash.  Neurological:     Mental Status: She is alert and oriented to person, place, and time.     Cranial Nerves: No cranial nerve deficit, dysarthria or facial asymmetry.     Sensory: No sensory deficit.     Motor: No  weakness, tremor or pronator drift.     Coordination: Romberg sign positive.     Gait: Gait normal.  Psychiatric:        Behavior: Behavior normal.     (all labs ordered are listed, but only abnormal results are displayed) Labs Reviewed  COMPREHENSIVE METABOLIC PANEL WITH GFR - Abnormal; Notable for the following components:      Result Value   Glucose, Bld 121 (*)    Total Protein 8.3 (*)    All other components within normal limits  CBC WITH DIFFERENTIAL/PLATELET  PREGNANCY, URINE  CBG MONITORING, ED    EKG: EKG Interpretation Date/Time:  Tuesday January 22 2024 16:05:05 EST Ventricular Rate:  100 PR Interval:  133 QRS Duration:  87 QT Interval:  312 QTC Calculation: 403 R Axis:   77  Text Interpretation: Sinus tachycardia  Nonspecific T abnormalities, diffuse leads Baseline wander in lead(s) II III aVF V3 V4 V5 Confirmed by Bari Flank [id number]) on 01/22/2024 7:33:40 PM  Radiology: CT VENOGRAM HEAD Result Date: 01/22/2024 EXAM: CT VENOGRAM WITH CONTRAST 01/22/2024 08:25:16 PM TECHNIQUE: CT venogram of the head/brain was performed with the administration of intravenous contrast. Multiplanar reformatted images are provided for review. MIP images are provided for review. Automated exposure control, iterative reconstruction, and/or weight based adjustment of the mA/kV was utilized to reduce the radiation dose to as low as reasonably achievable. COMPARISON: Same day CT head. CLINICAL HISTORY: Headache, syncope, amnesia FINDINGS: No dural venous sinus thrombosis. No significant stenosis. IMPRESSION: 1. No dural venous sinus thrombosis. Electronically signed by: Gilmore Molt 01/22/2024 08:55 PM EST RP Workstation: [id number]   CT Angio Head W or Wo Contrast Result Date: 01/22/2024 EXAM: CTA Head without or with Intravenous Contrast CLINICAL HISTORY: Headache, sudden, severe; syncope, amnesia, headache TECHNIQUE: Axial CTA images of the head without or with intravenous contrast. MIP reconstructed images were created and reviewed. Dose reduction technique was used including one or more of the following: automated exposure control, adjustment of mA and kV according to patient size, and/or iterative reconstruction. CONTRAST: Without or with; COMPARISON: None provided. FINDINGS: INTERNAL CAROTID ARTERIES: The intracranial ICAs are patent with no significant stenosis. No occlusion. No aneurysm. ANTERIOR CEREBRAL ARTERIES: No significant stenosis. No occlusion. No aneurysm. MIDDLE CEREBRAL ARTERIES: No significant stenosis. No occlusion. No aneurysm. POSTERIOR CEREBRAL ARTERIES: No significant stenosis. No occlusion. No aneurysm. BASILAR ARTERY: No significant stenosis. No occlusion. No aneurysm. VERTEBRAL ARTERIES: No significant stenosis.  No occlusion. No aneurysm. SOFT TISSUES: No acute finding. No masses or lymphadenopathy. BONES: No acute osseous abnormality. IMPRESSION: 1. No large vessel occlusion, significant stenosis or aneurysm. Electronically signed by: Gilmore Molt 01/22/2024 08:51 PM EST RP Workstation: [id number]   CT Head Wo Contrast Result Date: 01/22/2024 EXAM: CT HEAD WITHOUT CONTRAST 01/22/2024 04:13:09 PM TECHNIQUE: CT of the head was performed without the administration of intravenous contrast. Automated exposure control, iterative reconstruction, and/or weight based adjustment of the mA/kV was utilized to reduce the radiation dose to as low as reasonably achievable. COMPARISON: None available. CLINICAL HISTORY: syncope, cause unknown, now with headache FINDINGS: BRAIN AND VENTRICLES: No acute intracranial hemorrhage or mass effect. No evidence of acute infarct. No hydrocephalus. No extra-axial collection. No midline shift. ORBITS: No acute abnormality. SINUSES: No acute abnormality. SOFT TISSUES AND SKULL: No acute soft tissue abnormality. No skull fracture. IMPRESSION: 1. No acute intracranial abnormality. Electronically signed by: Prentice Spade MD 01/22/2024 04:59 PM EST RP Workstation: [id number]     Procedures   Medications Ordered in the ED  dexamethasone  (DECADRON ) injection 10 mg (has no administration in time range)  meclizine  (ANTIVERT ) tablet 12.5 mg (has no administration in time range)  metoCLOPramide  (REGLAN ) injection 10 mg (has no administration in time range)  sodium chloride  0.9 % bolus 1,000 mL (0 mLs Intravenous Stopped 01/22/24 2134)  iohexol  (OMNIPAQUE ) 350 MG/ML injection 75 mL (75 mLs Intravenous Contrast Given 01/22/24 2015)    Clinical Course as of 01/22/24 2152  Tue Jan 22, 2024  1921 Lying BP 112/78 HR 73 Sitting BP 118/82  HR 102 Standing BP 115/83 HR 108 [LM]    Clinical Course User Index [LM] Beverley Leita LABOR, PA-C                                 Medical Decision  Making Amount and/or Complexity of Data Reviewed Labs: ordered. Radiology: ordered.  Risk Prescription drug management.   This patient presents to the ED for concern of headache, dizziness, ?syncope, this involves an extensive number of treatment options, and is a complaint that carries with it a high risk of complications and morbidity.  The differential diagnosis includes but not limited to migraine, intracranial injury, seizure, sleep walking, venous sinus thrombosis, SAH, arrhythmia, electrolyte or metabolic disturbance   Co morbidities / Chronic conditions that complicate the patient evaluation  Otherwise healthy   Additional history obtained:  Additional history obtained from EMR External records from outside source obtained and reviewed including recent visit to PCP dated 01/01/2024 for contraception   Lab Tests:  I Ordered, and personally interpreted labs.  The pertinent results include: CBC within limits.  CMP without significant findings.  Urine hCG pending at time of signout to oncoming provider   Imaging Studies ordered:  I ordered imaging studies including CT head, CTA head and neck, CT venogram I independently visualized and interpreted imaging which showed no acute process I agree with the radiologist interpretation   Cardiac Monitoring: / EKG:  The patient was maintained on a cardiac monitor.  I personally viewed and interpreted the cardiac monitored which showed an underlying rhythm of: Sinus tachycardia, rate 100   Problem List / ED Course / Critical interventions / Medication management  34 year old female presents with concern for possible syncopal episode.  Patient recalls going to bed last night and the next incident versus waking up on the floor in her bathroom this morning with a headache.  She does not have any traumatic injuries to suggest fall, no history of seizures.  Patient does not recall waking up with her alarm clock this morning.  She did  take Tylenol  for her headache without much improvement.  Workup today has been largely unrevealing.  She is provided with medication for her headache and care signed out pending reassessment. I ordered medication including IV fluids, Reglan , meclizine , Decadron  I have reviewed the patients home medicines and have made adjustments as needed   Consultations Obtained:  I requested consultation with the ER attending, Dr. Bari,  and discussed lab and imaging findings as well as pertinent plan - they recommend: Agrees with plan of care   Social Determinants of Health:  Has PCP   Test / Admission - Considered:  Disposition pending      Final diagnoses:  Nonintractable headache, unspecified chronicity pattern, unspecified headache type    ED Discharge Orders     None          Beverley Leita LABOR, PA-C 01/22/24 2152  "

## 2024-01-22 NOTE — ED Provider Triage Note (Signed)
 Emergency Medicine Provider Triage Evaluation Note  Shannon Hernandez , a 34 y.o. female  was evaluated in triage.  Pt complains of headache, dizziness (light headed, worse with standing). No relief with Tylenol  at 7am. This morning was in the bathroom when she passed out. Does not recall any details. Does not recall going to the bathroom or getting out of bed this morning.  No cardiac history Review of Systems  Positive: Headache Negative: Nausea, vomiting   Physical Exam  LMP 12/09/2023  Gen:   Awake, no distress   Resp:  Normal effort  MSK:   Moves extremities without difficulty  Other:    Medical Decision Making  Medically screening exam initiated at 3:57 PM.  Appropriate orders placed.  Shannon Hernandez was informed that the remainder of the evaluation will be completed by another provider, this initial triage assessment does not replace that evaluation, and the importance of remaining in the ED until their evaluation is complete.     Beverley Leita LABOR, PA-C 01/22/24 1559

## 2024-01-22 NOTE — Discharge Instructions (Signed)
 You have been seen and discharged from the emergency department.  Your blood work, CT of the head and blood vessels were normal.  You were treated for headache.  Follow-up with your primary provider for further evaluation and further care. Take home medications as prescribed. If you have any worsening symptoms or further concerns for your health please return to an emergency department for further evaluation.

## 2024-01-23 NOTE — ED Provider Notes (Signed)
 Patient signed out to me by previous provider. Please refer to their note for full HPI.  Briefly this is a 34 year old female who presented to the emergency department with loss of consciousness, headache.  Blood work and extensive CT, CTA and CTV imaging were negative.   Patient signed out pending headache treatment and reevaluation.   ED Course: On reevaluation the headache has resolved.  She reveals no other symptoms or concerns and her and spouse at bedside are requesting discharge.  Patient at this time appears safe and stable for discharge and close outpatient follow up. Discharge plan and strict return to ED precautions discussed, patient verbalizes understanding and agreement.   Bari Roxie HERO, DO 01/23/24 1731
